# Patient Record
Sex: Female | Born: 1960 | Race: Black or African American | Hispanic: No | Marital: Single | Smoking: Never smoker
Health system: Southern US, Community
[De-identification: ages and names within clinical notes are randomized; demographics above are authoritative.]

## PROBLEM LIST (undated history)

## (undated) DIAGNOSIS — I10 Essential (primary) hypertension: Secondary | ICD-10-CM

---

## 2016-01-22 ENCOUNTER — Encounter: Payer: Self-pay | Admitting: Radiology

## 2016-01-22 ENCOUNTER — Emergency Department: Payer: Self-pay

## 2016-01-22 ENCOUNTER — Emergency Department
Admission: EM | Admit: 2016-01-22 | Discharge: 2016-01-23 | Disposition: A | Discharge status: Home / Self Care | Payer: Self-pay | Attending: Emergency Medicine | Admitting: Emergency Medicine

## 2016-01-22 DIAGNOSIS — K862 Cyst of pancreas: Secondary | ICD-10-CM | POA: Insufficient documentation

## 2016-01-22 DIAGNOSIS — K869 Disease of pancreas, unspecified: Secondary | ICD-10-CM

## 2016-01-22 DIAGNOSIS — K769 Liver disease, unspecified: Secondary | ICD-10-CM | POA: Insufficient documentation

## 2016-01-22 DIAGNOSIS — R1012 Left upper quadrant pain: Secondary | ICD-10-CM

## 2016-01-22 DIAGNOSIS — N1330 Unspecified hydronephrosis: Secondary | ICD-10-CM | POA: Insufficient documentation

## 2016-01-22 LAB — CBC WITH DIFFERENTIAL/PLATELET
BASOS ABS: 0.1 10*3/uL (ref 0–0.1)
Basophils Relative: 1 %
EOS PCT: 2 %
Eosinophils Absolute: 0.1 10*3/uL (ref 0–0.7)
HCT: 40 % (ref 35.0–47.0)
Hemoglobin: 13.2 g/dL (ref 12.0–16.0)
LYMPHS ABS: 2.2 10*3/uL (ref 1.0–3.6)
LYMPHS PCT: 36 %
MCH: 30.7 pg (ref 26.0–34.0)
MCHC: 33.1 g/dL (ref 32.0–36.0)
MCV: 92.6 fL (ref 80.0–100.0)
MONO ABS: 0.5 10*3/uL (ref 0.2–0.9)
Monocytes Relative: 9 %
Neutro Abs: 3.2 10*3/uL (ref 1.4–6.5)
Neutrophils Relative %: 52 %
Platelets: 214 10*3/uL (ref 150–440)
RBC: 4.32 MIL/uL (ref 3.80–5.20)
RDW: 14.8 % — ABNORMAL HIGH (ref 11.5–14.5)
WBC: 6 10*3/uL (ref 3.6–11.0)

## 2016-01-22 LAB — URINALYSIS COMPLETE WITH MICROSCOPIC (ARMC ONLY)
BACTERIA UA: NONE SEEN
Bilirubin Urine: NEGATIVE
Glucose, UA: NEGATIVE mg/dL
Ketones, ur: NEGATIVE mg/dL
Leukocytes, UA: NEGATIVE
Nitrite: NEGATIVE
PH: 6 (ref 5.0–8.0)
PROTEIN: NEGATIVE mg/dL
Specific Gravity, Urine: 1.021 (ref 1.005–1.030)

## 2016-01-22 LAB — BASIC METABOLIC PANEL
Anion gap: 5 (ref 5–15)
BUN: 11 mg/dL (ref 6–20)
CO2: 28 mmol/L (ref 22–32)
CREATININE: 0.54 mg/dL (ref 0.44–1.00)
Calcium: 9.6 mg/dL (ref 8.9–10.3)
Chloride: 108 mmol/L (ref 101–111)
GFR calc Af Amer: >60 mL/min (ref >60)
GLUCOSE: 109 mg/dL — AB (ref 65–99)
POTASSIUM: 3.3 mmol/L — AB (ref 3.5–5.1)
Sodium: 141 mmol/L (ref 135–145)

## 2016-01-22 LAB — LIPASE, BLOOD: Lipase: 90 U/L — ABNORMAL HIGH (ref 11–51)

## 2016-01-22 LAB — POCT PREGNANCY, URINE: Preg Test, Ur: NEGATIVE

## 2016-01-22 MED ORDER — IOPAMIDOL (ISOVUE-300) INJECTION 61%
100.0000 mL | Freq: Once | INTRAVENOUS | Status: AC | PRN
  Administered 2016-01-22: 100 mL via INTRAVENOUS
  Filled 2016-01-22: qty 100

## 2016-01-22 MED ORDER — IOPAMIDOL (ISOVUE-300) INJECTION 61%
30.0000 mL | Freq: Once | INTRAVENOUS | Status: AC | PRN
  Administered 2016-01-22: 30 mL via ORAL
  Filled 2016-01-22: qty 30

## 2016-01-22 MED ORDER — KETOROLAC TROMETHAMINE 30 MG/ML IJ SOLN
30.0000 mg | Freq: Once | INTRAMUSCULAR | Status: AC
  Administered 2016-01-22: 30 mg via INTRAVENOUS
  Filled 2016-01-22: qty 1

## 2016-01-22 NOTE — ED Notes (Signed)
Pt tender to palpation of LUQ, LLQ, and left flank. Pt reports taking 200 mg ibuprofen approx 1800 with no relief

## 2016-01-22 NOTE — ED Provider Notes (Signed)
[facility] Emergency Department Provider Note  ____________________________________________  Time seen: Approximately 7:50 PM  I have reviewed the triage vital signs and the nursing notes.   HISTORY  Chief Complaint Back Pain    HPI Elizabeth Chambers is a 55 y.o. female , NAD, presents to the emergency department accompanied by her husband who assists with history. Patient states she has had left back, flank and upper abdominal pain over the last 2 days. Patient seems to originate in the left upper quadrant and can radiate down the abdomen and around the back. Not had any nausea, vomiting, diarrhea, constipation. No changes in urination and denies dysuria or hematuria. As had no fevers, chills, body aches or weakness. No chest pain or shortness of breath. No significant medical history. Has taken over-the-counter ibuprofen with no relief of pain. Denies any injuries or traumas to the back or abdomen.   History reviewed. No pertinent past medical history.  There are no active problems to display for this patient.   No past surgical history on file.  Prior to Admission medications   Not on File    Allergies Review of patient's allergies indicates no known allergies.  No family history on file.  Social History Social History  Substance Use Topics  . Smoking status: Not on file  . Smokeless tobacco: Not on file  . Alcohol use Not on file     Review of Systems  Constitutional: No fever/chills, Fatigue Cardiovascular: No chest pain. Respiratory: No shortness of breath.  Gastrointestinal: Positive left upper quadrant abdominal pain, left flank pain.  No nausea, vomiting.  No diarrhea, constipation. Genitourinary: Negative for dysuria, hematuria. No urinary hesitancy, urgency or increased frequency. Musculoskeletal: Positive for back pain.  Skin: Negative for rash, redness, swelling, skin sores. Neurological: Negative for numbness, weakness,  tingling. 10-point ROS otherwise negative.  ____________________________________________   PHYSICAL EXAM:  VITAL SIGNS: ED Triage Vitals  Enc Vitals Group     BP 01/22/16 1915 (!) 186/94     Pulse Rate 01/22/16 1915 60     Resp 01/22/16 1915 18     Temp 01/22/16 1915 98.2 F (36.8 C)     Temp Source 01/22/16 1915 Oral     SpO2 01/22/16 1915 100 %     Weight 01/22/16 1914 160 lb (72.6 kg)     Height 01/22/16 1914 5\' 6"  (1.676 m)     Head Circumference --      Peak Flow --      Pain Score 01/22/16 1914 10     Pain Loc --      Pain Edu? --      Excl. in [place]? --      Constitutional: Alert and oriented. Well appearing and in no acute distress. Eyes: Conjunctivae are normal.  Head: Atraumatic. Neck: Supple with full range of motion. Hematological/Lymphatic/Immunilogical: No cervical lymphadenopathy. Cardiovascular: Normal rate, regular rhythm. Normal S1 and S2.  Good peripheral circulation. Respiratory: Normal respiratory effort without tachypnea or retractions. Lungs CTAB with breath sounds noted in all lung fields. Gastrointestinal: Tenderness to palpation about the left upper quadrant, left abdominal wall, left lower quadrant and right lower quadrant with some guarding of the left upper quadrant. No rebound or rigidity. Bowel sounds are grossly normal active in all quadrants. All quadrants of the abdomen were soft. No CVA tenderness. Musculoskeletal: Mild tenderness to palpation about the left lateral mid back without muscle spasm. No midline spinal tenderness about the thoracic or lumbar spine. No lower extremity  tenderness nor edema.  No joint effusions. Neurologic:  Normal speech and language. No gross focal neurologic deficits are appreciated.  Skin:  Skin is warm, dry and intact. No rash noted. Psychiatric: Mood and affect are normal. Speech and behavior are normal. Patient exhibits appropriate insight and judgement.   ____________________________________________    LABS (all labs ordered are listed, but only abnormal results are displayed)  Labs Reviewed  BASIC METABOLIC PANEL - Abnormal; Notable for the following:       Result Value   Potassium 3.3 (*)    Glucose, Bld 109 (*)    All other components within normal limits  CBC WITH DIFFERENTIAL/PLATELET - Abnormal; Notable for the following:    RDW 14.8 (*)    All other components within normal limits  LIPASE, BLOOD - Abnormal; Notable for the following:    Lipase 90 (*)    All other components within normal limits  URINALYSIS COMPLETEWITH MICROSCOPIC ([facility] ONLY) - Abnormal; Notable for the following:    Color, Urine YELLOW (*)    APPearance CLEAR (*)    Hgb urine dipstick 1+ (*)    Squamous Epithelial / LPF 0-5 (*)    All other components within normal limits  URINE CULTURE  POC URINE PREG, ED  POCT PREGNANCY, URINE   ____________________________________________  EKG  None ____________________________________________  RADIOLOGY I have personally viewed and evaluated these images (plain radiographs) as part of my medical decision making, as well as reviewing the written report by the radiologist.  Ct Abdomen Pelvis W Contrast  Result Date: 01/22/2016 CLINICAL DATA:  55 y/o F; left-sided abdominal pain radiating to the left lower back for 2 days. Complaints of nausea. Elevated lipase. EXAM: CT ABDOMEN AND PELVIS WITH CONTRAST TECHNIQUE: Multidetector CT imaging of the abdomen and pelvis was performed using the standard protocol following bolus administration of intravenous contrast. CONTRAST:  139mL ISOVUE-300 IOPAMIDOL (ISOVUE-300) INJECTION 61% COMPARISON:  None. FINDINGS: Lower chest: No acute abnormality. Hepatobiliary: 2.3 cm segment 7/8 flash filling lesion with late equilibration. Hypoattenuation with near the falciform ligament is likely represent focal steatosis. No other liver lesion identified. Normal gallbladder. No intra or extrahepatic biliary ductal dilatation. Pancreas: 10  mm cystic lesion in head of pancreas (series 2, image 31) probably in communication with the main pancreatic duct (series 5, image 41). No dilatation of the main pancreatic duct. No other pancreatic lesion or inflammatory changes are identified. No peripancreatic fluid collection or evidence for necrosis. Spleen: Normal in size without focal abnormality. Adrenals/Urinary Tract: Normal adrenal glands. Right kidney interpolar 9 mm lucency is probably a cyst. No other renal lesion or stone is identified. Mild left-sided hydronephrosis and question minimal urothelial enhancement of the ureter (series 2, image 70). No obstructing stone or lesion is identified. Bladder is unremarkable. Mild right pelviectasis. Stomach/Bowel: Stomach is within normal limits. Appendix appears normal. No evidence of bowel wall thickening, distention, or inflammatory changes. Vascular/Lymphatic: Aortic atherosclerosis. No enlarged abdominal or pelvic lymph nodes. Reproductive: The uterus and right adnexa is normal. Dense calcification in the left adnexa measuring up to 19 mm (series 2, image 71). Other: No abdominal wall hernia or abnormality. No abdominopelvic ascites. Musculoskeletal: No acute or significant osseous findings. IMPRESSION: 1. No peripancreatic fluid collection or necrosis identified. 2. Mild left hydronephrosis and question urothelial enhancement without an obstructing stone or lesion. This may represent urinary tract infection or reflux. 3. **An incidental finding of potential clinical significance has been found. 2.3 cm liver segment 7/8 flash filling lesion. Further  evaluation with hepatic MRI with contrast is recommended with possible biopsy if high risk patient. This recommendation follows ACR consensus guidelines: Managing Incidental Findings on Abdominal CT: White Paper of the ACR Incidental Findings Committee. J Am Coll Radiol 2010;7:754-773** 4. **An incidental finding of potential clinical significance has been  found. 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 Follow-up imaging in 1 year is recommended with pancreas protocol MRI or CT with contrast. This recommendation follows ACR consensus guidelines: Management of Incidental Pancreatic Cysts: A White Paper of the ACR Incidental Findings Committee. [place] [id number].** 5. Dense left adnexa calcification of uncertain significance, possibly sequelae of prior hematoma, underlying dermoid is also possible. This can be further characterized with pelvic ultrasound as clinically indicated. Electronically Signed   By: Kristine Garbe M.D.   On: 01/22/2016 22:55    ____________________________________________    PROCEDURES  Procedure(s) performed: None   Procedures   Medications  ketorolac (TORADOL) 30 MG/ML injection 30 mg (30 mg Intravenous Given 01/22/16 2016)  iopamidol (ISOVUE-300) 61 % injection 30 mL (30 mLs Oral Contrast Given 01/22/16 2135)  iopamidol (ISOVUE-300) 61 % injection 100 mL (100 mLs Intravenous Contrast Given 01/22/16 2137)     ____________________________________________   INITIAL IMPRESSION / ASSESSMENT AND PLAN / ED COURSE  Pertinent labs & imaging results that were available during my care of the patient were reviewed by me and considered in my medical decision making (see chart for details).  Clinical Course  Comment By Time  Laboratory results were discussed with patient and her husband at the bedside and she is in agreement to move forward with CT abdomen and pelvis with contrast. Braxton Feathers, PA-C 09/29 2040  Patient notes pain has decreased with Toradol IV. Still waiting on CT scan read. Braxton Feathers, PA-C 09/29 2240  I spoke with Dr. Corinna Capra in regards to the patient's history, physical exam, lab results and imaging results. He suggests consultation with GI in regards to the CT results. Braxton Feathers, PA-C  09/29 2335  I have paged Dr. Vira Agar who is on call for gastroenterology Braxton Feathers, PA-C 09/29 2338  Dr. Vira Agar is not on call for GI. Dr. Truman Hayward is on call. I have asked the ED secretary to page Dr. Truman Hayward. Braxton Feathers, PA-C 09/29 2355  I spoke with Dr. Truman Hayward who was on call for gastroenterology. He states as long as the patient is clinically stable she may be discharged with instructions to follow up with Dr. Truman Hayward for further outpatient workup of the pancreatic lesion. Braxton Feathers, PA-C 09/30 0000  I have spoken with the patient and her husband at the bedside in regards to CT results as well as the had with Dr. Truman Hayward. All questions were answered to their satisfaction. The patient verbalizes that she will call Dr. Marguerita Beards office on Monday to schedule follow-up of her CT findings. Currently the patient is without pain and considering her other laboratory evaluations were without significant abnormality, we will discharge her home with strict instructions to continue follow up with Dr. Truman Hayward outpatient. Braxton Feathers, PA-C 09/30 0012    Patient's diagnosis is consistent with Left upper quadrant abdominal pain, incidental pancreatic lesion found on CT, incidental lesion of liver found on CT, hydronephrosis of the left kidney. Patient will be discharged home with instructions to take over-the-counter ibuprofen or Tylenol as needed for pain. Patient is to  follow up with Dr. Truman Hayward in gastroenterology next week for further evaluation of pancreatic and liver lesions. Patient is also to follow-up with Dr. Alyson Ingles in urology in regards to left kidney hydronephrosis. No bacteria was noted on urinalysis as well as negative for leukocytes or nitrates. Urine culture was sent to further evaluate for potential urinary infection. At this time patient is asymptomatic of UTI and with a normal white blood count, antibiotics were not started at this time. If urine culture returns positive for infection or heavy growth of bacteria  patient will be called in antibiotics begun. Patient is given ED precautions to return to the ED for any worsening or new symptoms.    ____________________________________________  FINAL CLINICAL IMPRESSION(S) / ED DIAGNOSES  Final diagnoses:  LUQ abdominal pain  Pancreatic lesion  Lesion of liver  Hydronephrosis of left kidney      NEW MEDICATIONS STARTED DURING THIS VISIT:  New Prescriptions   No medications on file         Braxton Feathers, PA-C 01/23/16 0017    Eula Listen, MD 01/24/16 2016

## 2016-01-22 NOTE — ED Notes (Signed)
Pt c/o left-sided abdominal pain radiating to lower left back X 2 days. Pt c/o nausea, denies pain. Pt denies changes to urination.

## 2016-01-22 NOTE — ED Triage Notes (Signed)
Patient reports left lower back pain since yesterday, denies any type of injury.  Denies urinary symptoms.

## 2016-01-23 NOTE — ED Notes (Signed)
Reviewed d/c instructions, follow-up care with pt. Pt verbalized understanding 

## 2016-01-24 LAB — URINE CULTURE: SPECIAL REQUESTS: NORMAL

## 2016-01-25 ENCOUNTER — Telehealth: Payer: Self-pay

## 2016-01-25 NOTE — Telephone Encounter (Signed)
Dr. Allen Norris, please review images and advise. Pt has a large lesion on her pancreas and a lesion noted on her liver. Was seen in the ER for abdominal pain.

## 2016-01-25 NOTE — Telephone Encounter (Signed)
Patient is seen in Emergency Room. Reviewed chart and spoke with Dr. Dorothey Baseman nurse at this time. We will speak with Dr. Allen Norris and return phone call to patient.

## 2016-01-27 ENCOUNTER — Emergency Department: Payer: Self-pay

## 2016-01-27 ENCOUNTER — Ambulatory Visit (INDEPENDENT_AMBULATORY_CARE_PROVIDER_SITE_OTHER): Payer: Self-pay | Admitting: Urology

## 2016-01-27 ENCOUNTER — Emergency Department
Admission: EM | Admit: 2016-01-27 | Discharge: 2016-01-27 | Disposition: A | Discharge status: Home / Self Care | Payer: Self-pay | Attending: Student | Admitting: Student

## 2016-01-27 ENCOUNTER — Encounter: Payer: Self-pay | Admitting: Emergency Medicine

## 2016-01-27 VITALS — BP 212/108 | HR 76 | Ht 65.0 in | Wt 158.0 lb

## 2016-01-27 DIAGNOSIS — Z791 Long term (current) use of non-steroidal anti-inflammatories (NSAID): Secondary | ICD-10-CM | POA: Insufficient documentation

## 2016-01-27 DIAGNOSIS — N1330 Unspecified hydronephrosis: Secondary | ICD-10-CM

## 2016-01-27 DIAGNOSIS — R202 Paresthesia of skin: Secondary | ICD-10-CM | POA: Insufficient documentation

## 2016-01-27 DIAGNOSIS — I10 Essential (primary) hypertension: Secondary | ICD-10-CM | POA: Insufficient documentation

## 2016-01-27 LAB — COMPREHENSIVE METABOLIC PANEL
ALBUMIN: 4.1 g/dL (ref 3.5–5.0)
ALK PHOS: 70 U/L (ref 38–126)
ALT: 16 U/L (ref 14–54)
AST: 17 U/L (ref 15–41)
Anion gap: 6 (ref 5–15)
BILIRUBIN TOTAL: 0.5 mg/dL (ref 0.3–1.2)
BUN: 15 mg/dL (ref 6–20)
CALCIUM: 9.6 mg/dL (ref 8.9–10.3)
CO2: 26 mmol/L (ref 22–32)
Chloride: 107 mmol/L (ref 101–111)
Creatinine, Ser: 0.66 mg/dL (ref 0.44–1.00)
GFR calc Af Amer: >60 mL/min (ref >60)
GFR calc non Af Amer: >60 mL/min (ref >60)
GLUCOSE: 94 mg/dL (ref 65–99)
Potassium: 3.8 mmol/L (ref 3.5–5.1)
Sodium: 139 mmol/L (ref 135–145)
TOTAL PROTEIN: 7.4 g/dL (ref 6.5–8.1)

## 2016-01-27 LAB — CBC WITH DIFFERENTIAL/PLATELET
BASOS ABS: 0.1 10*3/uL (ref 0–0.1)
BASOS PCT: 1 %
Eosinophils Absolute: 0.1 10*3/uL (ref 0–0.7)
Eosinophils Relative: 2 %
HEMATOCRIT: 38.4 % (ref 35.0–47.0)
HEMOGLOBIN: 13.1 g/dL (ref 12.0–16.0)
Lymphocytes Relative: 39 %
Lymphs Abs: 2.7 10*3/uL (ref 1.0–3.6)
MCH: 31.2 pg (ref 26.0–34.0)
MCHC: 34.2 g/dL (ref 32.0–36.0)
MCV: 91.1 fL (ref 80.0–100.0)
MONOS PCT: 9 %
Monocytes Absolute: 0.6 10*3/uL (ref 0.2–0.9)
NEUTROS ABS: 3.5 10*3/uL (ref 1.4–6.5)
NEUTROS PCT: 49 %
Platelets: 233 10*3/uL (ref 150–440)
RBC: 4.21 MIL/uL (ref 3.80–5.20)
RDW: 14.7 % — ABNORMAL HIGH (ref 11.5–14.5)
WBC: 7 10*3/uL (ref 3.6–11.0)

## 2016-01-27 LAB — URINALYSIS, COMPLETE
Bilirubin, UA: NEGATIVE
Glucose, UA: NEGATIVE
KETONES UA: NEGATIVE
Leukocytes, UA: NEGATIVE
NITRITE UA: NEGATIVE
Protein, UA: NEGATIVE
Specific Gravity, UA: 1.02 (ref 1.005–1.030)
Urobilinogen, Ur: 1 mg/dL (ref 0.2–1.0)
pH, UA: 7 (ref 5.0–7.5)

## 2016-01-27 LAB — MICROSCOPIC EXAMINATION: Epithelial Cells (non renal): >10 /hpf — AB (ref 0–10)

## 2016-01-27 LAB — TROPONIN I
Troponin I: <0.03 ng/mL (ref <0.03)
Troponin I: <0.03 ng/mL (ref <0.03)

## 2016-01-27 MED ORDER — HYDROCHLOROTHIAZIDE 12.5 MG PO CAPS: 12.5000 mg | ORAL_CAPSULE | Freq: Every day | ORAL | 0 refills | Status: DC

## 2016-01-27 MED ORDER — HYDRALAZINE HCL 20 MG/ML IJ SOLN
10.0000 mg | Freq: Once | INTRAMUSCULAR | Status: AC
  Administered 2016-01-27: 10 mg via INTRAVENOUS
  Filled 2016-01-27: qty 1

## 2016-01-27 NOTE — Progress Notes (Signed)
   01/27/2016 9:18 AM   Elizabeth Chambers 05/07/1960 [id number]  Patient presented today to the office for new patient evaluation. On intake, her vitals were blood pressure 212/108, repeat 223/122. She also is endorsing chest pain and left shoulder pain. Heart rate was normal.  No lightheadedness or dizziness.  Given these abnormal vital findings and chest pain, patient was advised to go immediately to the emergency room. She is stable to drive herself and is accompanied by a friend/ family member.    UA/UCx ordered today.  She will reschedule at her convince.    Hollice Espy, MD

## 2016-01-27 NOTE — ED Triage Notes (Signed)
Patient presents to the ED today for htn.  Patient went to her follow-up appointment this morning after being seen in the ED on Friday for upper abdominal pain.  Patient states, "I went to get my pancreas checked out today."  Patient reports going to see Dr. Nehemiah Massed, the cardiologist.  Patient's blood pressure was over 200/100 at the office and was immediately sent to the ED.  Patient denies headache, dizziness, and blurry vision.  Reports occasional left arm tingling and numbness x 2 weeks.  Patient denies chest pain at this time.  Patient ambulatory to triage with no obvious distress at this time.

## 2016-01-27 NOTE — ED Provider Notes (Signed)
[facility] Emergency Department Provider Note   ____________________________________________   First MD Initiated Contact with Patient 01/27/16 [phone]     (approximate)  I have reviewed the triage vital signs and the nursing notes.   HISTORY  Chief Complaint Hypertension    HPI Elizabeth Chambers is a 55 y.o. female with no chronic medical problems who presents for evaluation of hypertension noted today, gradual onset, constant, severe, no modifying factors. She was seen in this emergency department on 01/22/2016 for left abdominal/flank pain. CT scan showed a pancreatic cyst as well as left-sided hydronephrosis, she was referred to urology for follow-up. She was being evaluated at the urologists office today at her follow-up appointment for the hydronephrosis when her blood pressure was noted to be severely elevated and she was sent to the emergency department. She denies any chest pain or difficulty breathing currently, reports that when she was seen on 12/2915 she had "a little" left-sided chest pain but none since. No shortness of breath. No headache, no blurred vision, no numbness or weakness in the arms or legs. She did report that intermittently over the past 2 weeks she feels "cramping and tingling" in the left arm   History reviewed. No pertinent past medical history.  There are no active problems to display for this patient.   History reviewed. No pertinent surgical history.  Prior to Admission medications   Medication Sig Start Date End Date Taking? Authorizing Provider  acetaminophen (TYLENOL) 500 MG tablet Take 500 mg by mouth every 6 (six) hours as needed.   Yes Historical Provider, MD    Allergies Review of patient's allergies indicates no known allergies.  No family history on file.  Social History Social History  Substance Use Topics  . Smoking status: Never Smoker  . Smokeless tobacco: Never Used  . Alcohol use No    Review of  Systems Constitutional: No fever/chills Eyes: No visual changes. ENT: No sore throat. Cardiovascular: Denies chest pain currently. Respiratory: Denies shortness of breath. Gastrointestinal: No abdominal pain.  No nausea, no vomiting.  No diarrhea.  No constipation. Genitourinary: Negative for dysuria. Musculoskeletal: Negative for back pain. Skin: Negative for rash. Neurological: Negative for headaches, focal weakness or numbness.  10-point ROS otherwise negative.  ____________________________________________   PHYSICAL EXAM:  Vitals:   01/27/16 1230 01/27/16 1300 01/27/16 1330 01/27/16 1353  BP: (!) 165/91 (!) 175/85 (!) 153/98 (!) 154/85  Pulse: 73 69 82 71  Resp: (!) 24 (!) 21 (!) 22 (!) 22  Temp:      TempSrc:      SpO2:    100%  Weight:      Height:        VITAL SIGNS: ED Triage Vitals  Enc Vitals Group     BP 01/27/16 0942 (!) 204/91     Pulse Rate 01/27/16 0942 62     Resp 01/27/16 0942 18     Temp 01/27/16 0942 98 F (36.7 C)     Temp Source 01/27/16 0942 Oral     SpO2 01/27/16 0942 100 %     Weight 01/27/16 0943 158 lb (71.7 kg)     Height 01/27/16 0943 5\' 6"  (1.676 m)     Head Circumference --      Peak Flow --      Pain Score 01/27/16 0944 0     Pain Loc --      Pain Edu? --      Excl. in [place]? --  Constitutional: Alert and oriented. Well appearing and in no acute distress. Eyes: Conjunctivae are normal. PERRL. EOMI. Head: Atraumatic. Nose: No congestion/rhinnorhea. Mouth/Throat: Mucous membranes are moist.  Oropharynx non-erythematous. Neck: No stridor.  Supple without meningismus. Cardiovascular: Normal rate, regular rhythm. Grossly normal heart sounds.  Good peripheral circulation. Respiratory: Normal respiratory effort.  No retractions. Lungs CTAB. Gastrointestinal: Soft and nontender. No distention. No CVA tenderness. Genitourinary: deferred Musculoskeletal: No lower extremity tenderness nor edema.  No joint effusions. Neurologic:  Normal  speech and language. No gross focal neurologic deficits are appreciated. No gait instability. 5 out of 5 strength in bilateral upper and lower extremities, sensation intact to light touch throughout, cranial nerves II through XII intact, normal finger-nose-finger, normal heel shin Skin:  Skin is warm, dry and intact. No rash noted. Psychiatric: Mood and affect are normal. Speech and behavior are normal.  ____________________________________________   LABS (all labs ordered are listed, but only abnormal results are displayed)  Labs Reviewed  CBC WITH DIFFERENTIAL/PLATELET - Abnormal; Notable for the following:       Result Value   RDW 14.7 (*)    All other components within normal limits  COMPREHENSIVE METABOLIC PANEL  TROPONIN I  TROPONIN I   ____________________________________________  EKG  ED ECG REPORT I, Joanne Gavel, the attending physician, personally viewed and interpreted this ECG.   Date: 01/27/2016  EKG Time: 09:39  Rate: 62  Rhythm: normal sinus rhythm  Axis: normal  Intervals:none  ST&T Change: No acute ST elevation. ST depression in lead 2, 3, aVF, V4, V5, V6 secondary to LVH with repolarization abnormality.  ____________________________________________  RADIOLOGY  CT head IMPRESSION:  Normal head CT.       CXR IMPRESSION:  No active cardiopulmonary disease.    ____________________________________________   PROCEDURES  Procedure(s) performed: None  Procedures  Critical Care performed: No  ____________________________________________   INITIAL IMPRESSION / ASSESSMENT AND PLAN / ED COURSE  Pertinent labs & imaging results that were available during my care of the patient were reviewed by me and considered in my medical decision making (see chart for details).  Elizabeth Chambers is a 55 y.o. female with no chronic medical problems who presents for evaluation of hypertension noted today, gradual onset, constant, severe, no modifying  factors. On exam, she is well-appearing and in no acute distress. Her blood pressure was elevated 204/91 on arrival however technically, at this time she is asymptomatic. Will lower her blood pressure slowly. Given her reports of chest pain several days ago as well as intermittent numbness/tingling in the left arm, we'll obtain screening labs, CT head, chest x-ray to evaluate for end organ dysfunction. Currently, she has an intact neurological examination and no complaints.  ----------------------------------------- 1:54 PM on 01/27/2016 ----------------------------------------- Patient continues to appear well with no complaints. Blood pressure has improved. 2 sets of cardiac enzymes are negative. CBC CMP unremarkable. CT head shows no acute intracranial process. Chest x-ray shows no active cardiopulmonary disease. I discussed the EKG with Dr. Humphrey Rolls of cardiology who believes that the ST depressions noted are most consistent with LVH with repolarization abnormality and are not ischemic, he will see the patient in clinic tomorrow at 1:30 PM. Her clinical picture is not consistent with ACS, there is no evidence of end organ dysfunction due to her hypertension. We discussed return precautions and need for close follow-up and she is comfortable with the discharge plan. DC home with HCTZ.   Clinical Course     ____________________________________________   FINAL CLINICAL  IMPRESSION(S) / ED DIAGNOSES  Final diagnoses:  Essential hypertension      NEW MEDICATIONS STARTED DURING THIS VISIT:  New Prescriptions   No medications on file     Note:  This document was prepared using Dragon voice recognition software and may include unintentional dictation errors.    Joanne Gavel, MD 01/27/16 1356

## 2016-01-28 ENCOUNTER — Other Ambulatory Visit: Payer: Self-pay

## 2016-01-28 DIAGNOSIS — K862 Cyst of pancreas: Secondary | ICD-10-CM

## 2016-01-28 DIAGNOSIS — K769 Liver disease, unspecified: Secondary | ICD-10-CM

## 2016-01-29 ENCOUNTER — Other Ambulatory Visit: Payer: Self-pay

## 2016-01-29 ENCOUNTER — Telehealth: Payer: Self-pay

## 2016-01-29 DIAGNOSIS — K862 Cyst of pancreas: Secondary | ICD-10-CM

## 2016-01-29 NOTE — Telephone Encounter (Signed)
  Oncology Nurse Navigator Documentation Received referral from Dr. Allen Norris for EUS to evaluate a pancreatic cyst. Scheduled for 11/9 at [facility] with Dr Cephas Darby. Voicemail left with [name] to notify and educate regarding instructions. Call back number left. Navigator Location: [facility] (01/29/16 0900) Navigator Encounter Type: Telephone (01/29/16 0900) Telephone: Outgoing Call (01/29/16 0900)             Barriers/Navigation Needs: Coordination of Care (01/29/16 0900)   Interventions: Coordination of Care (01/29/16 0900)   Coordination of Care: EUS (01/29/16 0900)                  Time Spent with Patient: 15 (01/29/16 0900)

## 2016-02-01 ENCOUNTER — Telehealth: Payer: Self-pay

## 2016-02-01 NOTE — Telephone Encounter (Signed)
Tried contacting pt several times  to give MRI appt information to. VM was full and I was not able to leave message.

## 2016-02-02 ENCOUNTER — Telehealth: Payer: Self-pay | Admitting: Emergency Medicine

## 2016-02-02 ENCOUNTER — Telehealth: Payer: Self-pay

## 2016-02-02 NOTE — Telephone Encounter (Signed)
  Oncology Nurse Navigator Documentation                                Another attempt made at contacting Ms. Limehouse to schedule EUS for pancreatic findings. Voicemail is full, unable to leave message.                     Oncology Nurse Navigator Documentation  Navigator Location: [facility] (02/02/16 1500) Navigator Encounter Type: Telephone (02/02/16 1500) Telephone: [place] Call (02/02/16 1500)                     Coordination of Care: EUS (02/02/16 1500)                  Time Spent with Patient: 15 (02/02/16 1500)

## 2016-02-02 NOTE — Telephone Encounter (Signed)
Called patient to assure she is aware of incidental finding on ct report.  She says she is aware, and has made appt for mri and pancreas follow up appt .

## 2016-02-03 ENCOUNTER — Telehealth: Payer: Self-pay

## 2016-02-03 NOTE — Telephone Encounter (Signed)
  Oncology Nurse Navigator Documentation Spoke with Angela Burke (significant other). EUS has been scheduled for 11-9 with Dr Cephas Darby at [facility]. Went over instructions and copy also mailed to home address. Denies diabetes. Only medication is HCTZ. Denies anitcoagulants. Had questions regarding upcoming appts. Went over MRI for 10/17 and new patient referral for Dr. Allen Norris 10/25. They are under the impression that on 10/25 she will be having a biopsy of something. I sent message to Ginger at Dr. Dorothey Baseman to call them regarding upcoming appt 10/25. He will be expecting call. Provided my contact information and encouraged him to call with any future questions regarding EUS.  INSTRUCTIONS FOR ENDOSCOPIC ULTRASOUND -Your procedure has been scheduled for November 9th with Dr. Cephas Darby at [facility]. -The hospital will contact you to pre-register over the phone.  -To get your scheduled arrival time, please call the Endoscopy unit at  [phone] between 1-3pm on:  November 8th   -ON THE DAY OF YOU PROCEDURE:   1. If you are scheduled for a morning procedure, nothing to drink after midnight  -If you are scheduled for an afternoon procedure, you may have clear liquids until 5 hours prior  to the procedure but no carbonated drinks or broth  2. NO FOOD THE DAY OF YOUR PROCEDURE  3. You may take your heart, seizure, blood pressure, Parkinson's or breathing medications at  6am with just enough water to get your pills down  4. Do not take any oral Diabetic medications the morning of your procedure.  5. If you are a diabetic and are using insulin, please notify your prescribing physician of this  procedure as your dose may need to be altered related to not being able to eat or drink.   5. Do not take Vitamins for 5 days before your procedure     -On the day of your procedure, come to the [facility] Admitting/Registration desk (First desk on the right) at the scheduled arrival time. You  MUST have someone drive you home from your procedure. You must have a responsible adult with a valid driver's license who is on site throughout your entire procedure and who can stay with you for several hours after your procedure. You may not go home alone in a taxi, shuttle [place] or bus, as the drivers will not be responsible for you.  --If you have any questions please call me at the above contact Navigator Location: [facility] (02/03/16 1400) Navigator Encounter Type: Telephone (02/03/16 1400) Telephone: [place] Call (02/03/16 1400)                     Coordination of Care: EUS (02/03/16 1400)                  Time Spent with Patient: 15 (02/03/16 1400)

## 2016-02-03 NOTE — Telephone Encounter (Signed)
Pt has been scheduled for an MRI of the Abdomen/Pelvis at [facility] on Tuesday, Oct 17th @ 8:00am. Pt will return my call to discuss appt information and instructions.

## 2016-02-04 ENCOUNTER — Other Ambulatory Visit: Payer: Self-pay

## 2016-02-09 ENCOUNTER — Ambulatory Visit
Admission: RE | Admit: 2016-02-09 | Discharge: 2016-02-09 | Disposition: A | Discharge status: Home / Self Care | Payer: Self-pay | Source: Ambulatory Visit | Attending: Gastroenterology | Admitting: Gastroenterology

## 2016-02-09 DIAGNOSIS — M47816 Spondylosis without myelopathy or radiculopathy, lumbar region: Secondary | ICD-10-CM | POA: Insufficient documentation

## 2016-02-09 DIAGNOSIS — N281 Cyst of kidney, acquired: Secondary | ICD-10-CM | POA: Insufficient documentation

## 2016-02-09 DIAGNOSIS — M5136 Other intervertebral disc degeneration, lumbar region: Secondary | ICD-10-CM | POA: Insufficient documentation

## 2016-02-09 DIAGNOSIS — K862 Cyst of pancreas: Secondary | ICD-10-CM | POA: Insufficient documentation

## 2016-02-09 DIAGNOSIS — K769 Liver disease, unspecified: Secondary | ICD-10-CM | POA: Insufficient documentation

## 2016-02-09 DIAGNOSIS — D1803 Hemangioma of intra-abdominal structures: Secondary | ICD-10-CM | POA: Insufficient documentation

## 2016-02-09 MED ORDER — GADOBENATE DIMEGLUMINE 529 MG/ML IV SOLN
15.0000 mL | Freq: Once | INTRAVENOUS | Status: AC | PRN
  Administered 2016-02-09: 15 mL via INTRAVENOUS

## 2016-02-17 ENCOUNTER — Ambulatory Visit: Payer: Self-pay | Admitting: Gastroenterology

## 2016-02-24 ENCOUNTER — Ambulatory Visit: Payer: Self-pay | Admitting: Urology

## 2016-02-24 ENCOUNTER — Encounter: Payer: Self-pay | Admitting: Urology

## 2016-03-02 ENCOUNTER — Other Ambulatory Visit: Payer: Self-pay | Admitting: Pathology

## 2016-03-03 ENCOUNTER — Encounter: Payer: Self-pay | Admitting: *Deleted

## 2016-03-03 ENCOUNTER — Ambulatory Visit: Payer: Self-pay | Admitting: Anesthesiology

## 2016-03-03 ENCOUNTER — Ambulatory Visit
Admission: RE | Admit: 2016-03-03 | Discharge: 2016-03-03 | Disposition: A | Discharge status: Home / Self Care | Payer: Self-pay | Source: Ambulatory Visit | Attending: Gastroenterology | Admitting: Gastroenterology

## 2016-03-03 ENCOUNTER — Encounter
Admission: RE | Disposition: A | Discharge status: Home / Self Care | Payer: Self-pay | Source: Ambulatory Visit | Attending: Gastroenterology

## 2016-03-03 ENCOUNTER — Inpatient Hospital Stay: Payer: Self-pay

## 2016-03-03 DIAGNOSIS — I10 Essential (primary) hypertension: Secondary | ICD-10-CM | POA: Insufficient documentation

## 2016-03-03 DIAGNOSIS — K862 Cyst of pancreas: Secondary | ICD-10-CM | POA: Insufficient documentation

## 2016-03-03 DIAGNOSIS — Z79899 Other long term (current) drug therapy: Secondary | ICD-10-CM | POA: Insufficient documentation

## 2016-03-03 DIAGNOSIS — F17200 Nicotine dependence, unspecified, uncomplicated: Secondary | ICD-10-CM | POA: Insufficient documentation

## 2016-03-03 DIAGNOSIS — J449 Chronic obstructive pulmonary disease, unspecified: Secondary | ICD-10-CM | POA: Insufficient documentation

## 2016-03-03 HISTORY — PX: UPPER ESOPHAGEAL ENDOSCOPIC ULTRASOUND (EUS): SHX6562

## 2016-03-03 HISTORY — DX: Essential (primary) hypertension: I10

## 2016-03-03 SURGERY — UPPER ESOPHAGEAL ENDOSCOPIC ULTRASOUND (EUS)
Anesthesia: General

## 2016-03-03 MED ORDER — LIDOCAINE 2% (20 MG/ML) 5 ML SYRINGE
INTRAMUSCULAR | Status: DC | PRN
  Administered 2016-03-03: 40 mg via INTRAVENOUS

## 2016-03-03 MED ORDER — SODIUM CHLORIDE 0.9 % IV SOLN
INTRAVENOUS | Status: DC
  Administered 2016-03-03: 1000 mL via INTRAVENOUS
  Administered 2016-03-03: 14:00:00 via INTRAVENOUS

## 2016-03-03 MED ORDER — PROPOFOL 10 MG/ML IV BOLUS
INTRAVENOUS | Status: DC | PRN
  Administered 2016-03-03: 100 mg via INTRAVENOUS

## 2016-03-03 MED ORDER — FENTANYL CITRATE (PF) 100 MCG/2ML IJ SOLN
INTRAMUSCULAR | Status: DC | PRN
  Administered 2016-03-03: 50 ug via INTRAVENOUS

## 2016-03-03 MED ORDER — MIDAZOLAM HCL 5 MG/5ML IJ SOLN
INTRAMUSCULAR | Status: DC | PRN
  Administered 2016-03-03: 1 mg via INTRAVENOUS

## 2016-03-03 MED ORDER — PROPOFOL 500 MG/50ML IV EMUL
INTRAVENOUS | Status: DC | PRN
  Administered 2016-03-03: 160 ug/kg/min via INTRAVENOUS

## 2016-03-03 NOTE — Op Note (Signed)
[facility] Gastroenterology Patient Name: Elizabeth Chambers Procedure Date: 03/03/2016 1:59 PM MRN: [id number] Account #: [id number] Date of Birth: 08/21/1960 Admit Type: Outpatient Age: 55 Room: [facility] ENDO ROOM 3 Gender: Female Note Status: Finalized Procedure:            Upper EUS Indications:          Pancreatic cyst on MRI in a patient without a prior                        history of pancreatitis Patient Profile:      Refer to note in patient chart for documentation of                        history and physical. Providers:            Lenetta Quaker. Cephas Darby, MD Referring MD:         Lucilla Lame MD, MD (Referring MD) Medicines:            Monitored Anesthesia Care Complications:        No immediate complications. Procedure:            Pre-Anesthesia Assessment:                       - See the other procedure note for documentation of the                        pre-procedure assessment.                       After obtaining informed consent, the endoscope was                        passed under direct vision. Throughout the procedure,                        the patient's blood pressure, pulse, and oxygen                        saturations were monitored continuously. The EUS GI                        Linear Array [id number] was introduced through the mouth,                        and advanced to the duodenum for ultrasound examination                        from the esophagus, stomach and duodenum. Findings:      Endoscopic Finding :      The entire visualized stomach was normal.      The duodenal bulb, first portion of the duodenum, second portion of the       duodenum and area of the papilla were normal.      Endosonographic Finding :      An anechoic lesion suggestive of a cyst was identified in the pancreatic       head. It is not in obvious communication with the pancreatic duct. The       lesion measured 9.9 mm by 8.2 mm in maximal cross-sectional diameter.   There was a single compartment without septae. The  outer wall of the       lesion was not seen. There was no associated mass. There was no internal       debris within the fluid-filled cavity.      Pancreatic parenchymal abnormalities were noted in the entire pancreas.       These consisted of hyperechoic strands. The pancreatic duct measured 2.3       mm in the head, 2.2 mm in the neck and 0.9 mm in the body. It was not       well visualized in the tail.      There was no sign of significant endosonographic abnormality in the       common bile duct and in the common hepatic duct. The CBD measured 2.3 mm       and CHD measured 3.5 mm.      There was no sign of significant endosonographic abnormality in the left       lobe of the liver.      No lymph nodes were visualized in the celiac region (level 20),       peripancreatic region and porta hepatis region. Impression:           EGD Impresssion:                       - The esophagus was not visuazlied with the duodenscope.                       - Normal stomach.                       - Normal duodenal bulb, first portion of the duodenum,                        second portion of the duodenum and area of the papilla.                       EUS Impression:                       - A cystic lesion was seen in the pancreatic head.                        Tissue has not been obtained. 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 Given size less than 1 cm and likely                        inability to obtain adequate fluid for CEA and amylase                        this was not sampled.                       - Pancreatic parenchymal abnormalities consisting of  hyperechoic strands were noted in the entire pancreas.                        While these findings can be seen in  chronic                        pancreatitis in isolation the do not fulfill criteria                        for chronic pancrea and can be seen in a normal                        pancreas.                       - There was no sign of significant pathology in the                        common bile duct and in the common hepatic duct.                       - There was no evidence of significant pathology in the                        left lobe of the liver.                       - No lymph nodes were visualized in the celiac region                        (level 20), peripancreatic region and porta hepatis                        region.                       - No specimens collected. Recommendation:       - Discharge patient to home (via wheelchair).                       - Perform MRCP in 1 year to ensure cyst stability.                       - Return to referring physician as previously scheduled.                       - The findings and recommendations were discussed with                        the patient and their family. Attending Participation:      I personally performed the entire procedure. Dr. Lenetta Quaker. Cephas Darby, MD Lenetta Quaker. Solenne Manwarren, MD 03/03/2016 2:42:20 PM This report has been signed electronically. Number of Addenda: 0 Note Initiated On: 03/03/2016 1:59 PM Estimated Blood Loss: Estimated blood loss: none.      [facility]

## 2016-03-03 NOTE — Anesthesia Preprocedure Evaluation (Signed)
Anesthesia Evaluation  Patient identified by MRN, date of birth, ID band Patient awake    Reviewed: Allergy & Precautions, NPO status , Patient's Chart, lab work & pertinent test results  Airway Mallampati: II       Dental  (+) Teeth Intact   Pulmonary COPD, Current Smoker,     + decreased breath sounds      Cardiovascular Exercise Tolerance: Good hypertension, Pt. on medications  Rhythm:Regular Rate:Normal     Neuro/Psych negative neurological ROS     GI/Hepatic negative GI ROS, Neg liver ROS,   Endo/Other  negative endocrine ROS  Renal/GU negative Renal ROS     Musculoskeletal   Abdominal Normal abdominal exam  (+)   Peds negative pediatric ROS (+)  Hematology negative hematology ROS (+)   Anesthesia Other Findings   Reproductive/Obstetrics                             Anesthesia Physical Anesthesia Plan  ASA: II  Anesthesia Plan: General   Post-op Pain Management:    Induction: Intravenous  Airway Management Planned: Natural Airway and Nasal Cannula  Additional Equipment:   Intra-op Plan:   Post-operative Plan:   Informed Consent: I have reviewed the patients History and Physical, chart, labs and discussed the procedure including the risks, benefits and alternatives for the proposed anesthesia with the patient or authorized representative who has indicated his/her understanding and acceptance.     Plan Discussed with: CRNA  Anesthesia Plan Comments:         Anesthesia Quick Evaluation

## 2016-03-03 NOTE — H&P (Signed)
PRE-PROCEDURE HISTORY AND PHYSICAL   Elizabeth Chambers presents for her scheduled Procedure(s): UPPER ESOPHAGEAL ENDOSCOPIC ULTRASOUND (EUS).  The indication for the procedure(s) is PANCREATIC CYST.  There have been no significant recent changes in the patient's medical status.  Past Medical History:  Diagnosis Date  . Hypertension     History reviewed. No pertinent surgical history.  Allergies No Known Allergies  Medications acetaminophen and hydrochlorothiazide  Physical Examination  Body mass index is 24.53 kg/m. Mental Status: Alert and oriented Lungs: CTAB Heart:RRR Abdomen: Soft NT ND BS present  ASSESSMENT AND PLAN  [name] has been evaluated and deemed appropriate to undergo the planned Procedure(s): UPPER ESOPHAGEAL ENDOSCOPIC ULTRASOUND (EUS).

## 2016-03-03 NOTE — Transfer of Care (Signed)
Immediate Anesthesia Transfer of Care Note  Patient: Elizabeth Chambers  Procedure(s) Performed: Procedure(s): UPPER ESOPHAGEAL ENDOSCOPIC ULTRASOUND (EUS) (N/A)  Patient Location: PACU and Endoscopy Unit  Anesthesia Type:General  Level of Consciousness: awake and patient cooperative  Airway & Oxygen Therapy: Patient Spontanous Breathing and Patient connected to nasal cannula oxygen  Post-op Assessment: Report given to RN and Post -op Vital signs reviewed and stable  Post vital signs: Reviewed and stable  Last Vitals:  Vitals:   03/03/16 1312  BP: (!) 157/89  Pulse: 68  Resp: 17  Temp: 36.3 C    Last Pain:  Vitals:   03/03/16 1312  TempSrc: Tympanic         Complications: No apparent anesthesia complications

## 2016-03-04 ENCOUNTER — Encounter: Payer: Self-pay | Admitting: Gastroenterology

## 2016-03-04 NOTE — Anesthesia Postprocedure Evaluation (Signed)
Anesthesia Post Note  Patient: Elizabeth Chambers  Procedure(s) Performed: Procedure(s) (LRB): UPPER ESOPHAGEAL ENDOSCOPIC ULTRASOUND (EUS) (N/A)  Patient location during evaluation: PACU Anesthesia Type: General Level of consciousness: awake Pain management: pain level controlled Vital Signs Assessment: post-procedure vital signs reviewed and stable Respiratory status: spontaneous breathing Cardiovascular status: blood pressure returned to baseline Anesthetic complications: no    Last Vitals:  Vitals:   03/03/16 1449 03/03/16 1459  BP: (!) 151/97 (!) 169/92  Pulse: (!) 55 66  Resp: (!) 21 (!) 21  Temp:      Last Pain:  Vitals:   03/03/16 1439  TempSrc: Tympanic                 VAN STAVEREN,Renatta Shrieves

## 2016-03-04 NOTE — Progress Notes (Signed)
  Oncology Nurse Navigator Documentation EUS results routed to Dr. Allen Norris. Navigator Location: [facility] (03/04/16 1000)   )Navigator Encounter Type: Letter/Fax/Email (03/04/16 1000)                                 Coordination of Care: EUS (03/04/16 1000)                  Time Spent with Patient: 15 (03/04/16 1000)

## 2016-03-07 ENCOUNTER — Telehealth: Payer: Self-pay

## 2016-03-07 NOTE — Telephone Encounter (Signed)
Pt has a follow up appt with Dr. Allen Norris tomorrow (03/08/16) to discuss EUS and MRI results. Appointment was confirmed today.

## 2016-03-07 NOTE — Telephone Encounter (Signed)
-----   Message from Lucilla Lame, MD sent at 03/07/2016  1:31 PM EST ----- Let the patient know that the endoscopic ultrasound did not show anything consistent with a cancerous lesion. It was recommended that she get a repeat MRCP in one year.

## 2016-03-08 ENCOUNTER — Ambulatory Visit: Payer: Self-pay | Admitting: Gastroenterology

## 2016-03-08 ENCOUNTER — Telehealth: Payer: Self-pay

## 2016-03-08 NOTE — Telephone Encounter (Signed)
Patient came 15 minutes late to her app. When asked if she had insurance, patient replied with a smirk comment "Doesn't it already tell you that I don't have insurance?" I replied back saying that I always make sure. I told the patient that it will be a $50 dollar down payment and that the rest will be billed to her. She then replied "Oh, since I dont have the $50 that means I can't be seen?" I let her know that I will ask the doctor and that I'd do it after checking in the next patient (that was here on time). During the time that I was checking in the patient, she stood up, walked out the door, and did not come back.

## 2017-02-08 ENCOUNTER — Emergency Department: Payer: No Typology Code available for payment source

## 2017-02-08 ENCOUNTER — Encounter: Payer: Self-pay | Admitting: Emergency Medicine

## 2017-02-08 ENCOUNTER — Emergency Department
Admission: EM | Admit: 2017-02-08 | Discharge: 2017-02-08 | Disposition: A | Discharge status: Home / Self Care | Payer: No Typology Code available for payment source | Attending: Emergency Medicine | Admitting: Emergency Medicine

## 2017-02-08 DIAGNOSIS — Y939 Activity, unspecified: Secondary | ICD-10-CM | POA: Diagnosis not present

## 2017-02-08 DIAGNOSIS — Y999 Unspecified external cause status: Secondary | ICD-10-CM | POA: Diagnosis not present

## 2017-02-08 DIAGNOSIS — S161XXA Strain of muscle, fascia and tendon at neck level, initial encounter: Secondary | ICD-10-CM | POA: Diagnosis not present

## 2017-02-08 DIAGNOSIS — I10 Essential (primary) hypertension: Secondary | ICD-10-CM | POA: Insufficient documentation

## 2017-02-08 DIAGNOSIS — F17200 Nicotine dependence, unspecified, uncomplicated: Secondary | ICD-10-CM | POA: Diagnosis not present

## 2017-02-08 DIAGNOSIS — M7918 Myalgia, other site: Secondary | ICD-10-CM

## 2017-02-08 DIAGNOSIS — Y92410 Unspecified street and highway as the place of occurrence of the external cause: Secondary | ICD-10-CM | POA: Insufficient documentation

## 2017-02-08 DIAGNOSIS — S199XXA Unspecified injury of neck, initial encounter: Secondary | ICD-10-CM | POA: Diagnosis present

## 2017-02-08 MED ORDER — TRAMADOL HCL 50 MG PO TABS: 50.0000 mg | ORAL_TABLET | Freq: Two times a day (BID) | ORAL | 0 refills | Status: DC | PRN

## 2017-02-08 MED ORDER — TRAMADOL HCL 50 MG PO TABS
50.0000 mg | ORAL_TABLET | Freq: Once | ORAL | Status: AC
Start: 2017-02-08 — End: 2017-02-08
  Administered 2017-02-08: 50 mg via ORAL
  Filled 2017-02-08: qty 1

## 2017-02-08 MED ORDER — IBUPROFEN 600 MG PO TABS
600.0000 mg | ORAL_TABLET | Freq: Once | ORAL | Status: AC
  Administered 2017-02-08: 600 mg via ORAL
  Filled 2017-02-08: qty 1

## 2017-02-08 MED ORDER — IBUPROFEN 600 MG PO TABS: 600.0000 mg | ORAL_TABLET | Freq: Three times a day (TID) | ORAL | 0 refills | Status: DC | PRN

## 2017-02-08 MED ORDER — CYCLOBENZAPRINE HCL 10 MG PO TABS
10.0000 mg | ORAL_TABLET | Freq: Once | ORAL | Status: AC
  Administered 2017-02-08: 10 mg via ORAL
  Filled 2017-02-08: qty 1

## 2017-02-08 MED ORDER — CYCLOBENZAPRINE HCL 10 MG PO TABS: 10.0000 mg | ORAL_TABLET | Freq: Three times a day (TID) | ORAL | 0 refills | Status: DC | PRN
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
## 2017-02-11 ENCOUNTER — Emergency Department
Admission: EM | Admit: 2017-02-11 | Discharge: 2017-02-11 | Disposition: A | Discharge status: Home / Self Care | Payer: No Typology Code available for payment source | Attending: Emergency Medicine | Admitting: Emergency Medicine

## 2017-02-11 DIAGNOSIS — M479 Spondylosis, unspecified: Secondary | ICD-10-CM | POA: Insufficient documentation

## 2017-02-11 DIAGNOSIS — M7918 Myalgia, other site: Secondary | ICD-10-CM

## 2017-02-11 DIAGNOSIS — I10 Essential (primary) hypertension: Secondary | ICD-10-CM | POA: Insufficient documentation

## 2017-02-11 DIAGNOSIS — M47892 Other spondylosis, cervical region: Secondary | ICD-10-CM

## 2017-02-11 DIAGNOSIS — M542 Cervicalgia: Secondary | ICD-10-CM | POA: Diagnosis present

## 2017-02-11 MED ORDER — METHOCARBAMOL 750 MG PO TABS: 750.0000 mg | ORAL_TABLET | Freq: Four times a day (QID) | ORAL | 0 refills | Status: DC

## 2017-02-11 MED ORDER — MELOXICAM 15 MG PO TABS: 15.0000 mg | ORAL_TABLET | Freq: Every day | ORAL | 0 refills | Status: DC
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
## 2017-02-11 NOTE — ED Notes (Signed)
Patient reports pain 7/10, understands AVS review and p[roviders instructions. Patient in no apparent distress.
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
## 2017-08-14 ENCOUNTER — Emergency Department: Payer: BLUE CROSS/BLUE SHIELD

## 2017-08-14 ENCOUNTER — Other Ambulatory Visit: Payer: Self-pay

## 2017-08-14 ENCOUNTER — Emergency Department
Admission: EM | Admit: 2017-08-14 | Discharge: 2017-08-14 | Disposition: A | Discharge status: Home / Self Care | Payer: BLUE CROSS/BLUE SHIELD | Attending: Emergency Medicine | Admitting: Emergency Medicine

## 2017-08-14 ENCOUNTER — Encounter: Payer: Self-pay | Admitting: Emergency Medicine

## 2017-08-14 DIAGNOSIS — I10 Essential (primary) hypertension: Secondary | ICD-10-CM | POA: Diagnosis not present

## 2017-08-14 DIAGNOSIS — M542 Cervicalgia: Secondary | ICD-10-CM | POA: Insufficient documentation

## 2017-08-14 MED ORDER — CYCLOBENZAPRINE HCL 5 MG PO TABS: 5.0000 mg | ORAL_TABLET | Freq: Three times a day (TID) | ORAL | 0 refills | Status: AC | PRN

## 2017-08-14 MED ORDER — MELOXICAM 15 MG PO TABS: 15.0000 mg | ORAL_TABLET | Freq: Every day | ORAL | 1 refills | Status: AC

## 2017-08-14 MED ORDER — KETOROLAC TROMETHAMINE 30 MG/ML IJ SOLN
30.0000 mg | Freq: Once | INTRAMUSCULAR | Status: AC
  Administered 2017-08-14: 30 mg via INTRAMUSCULAR
  Filled 2017-08-14: qty 1

## 2017-08-14 NOTE — ED Notes (Signed)
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
## 2018-01-17 IMAGING — CT CT ABD-PELV W/ CM
2 of 5 series · 14 of 46 positions shown, 16 images · IV contrast (APPLIED)
Comparison: None.

CLINICAL DATA: 55 y/o F; left-sided abdominal pain radiating to the
left lower back for 2 days. Complaints of nausea. Elevated lipase.

EXAM:
CT ABDOMEN AND PELVIS WITH CONTRAST
TECHNIQUE: Multidetector CT imaging of the abdomen and pelvis was performed
using the standard protocol following bolus administration of
intravenous contrast.
CONTRAST:  100mL DPMECX-EXX IOPAMIDOL (DPMECX-EXX) INJECTION 61%

[Series 2: axial st · axial · 0.73mm/px · z∈[-784,-384]mm · 11 of 91 slices shown, 13 images]
[im 6/91  soft-tissue]
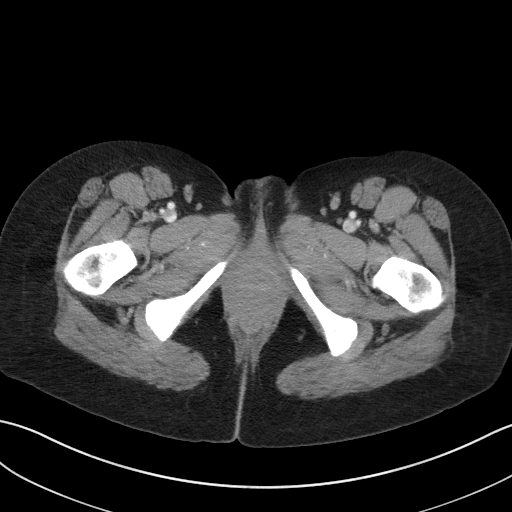
[im 6/91  bone]
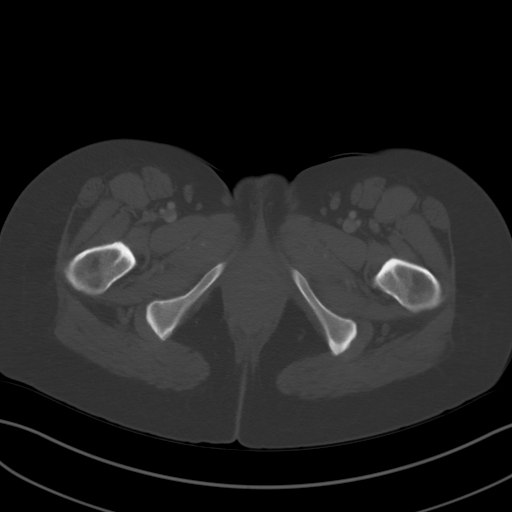
[im 16/91  soft-tissue]
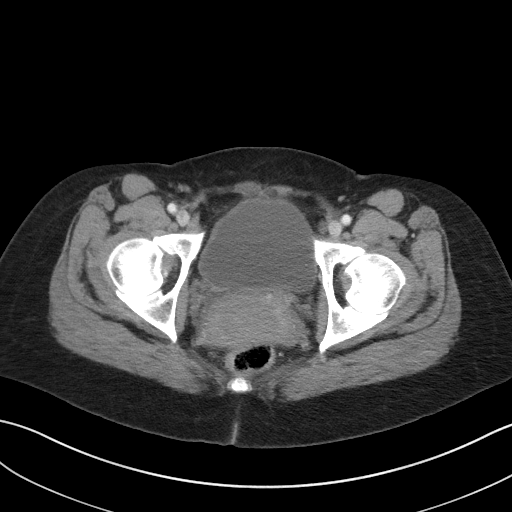
[im 21/91  soft-tissue]
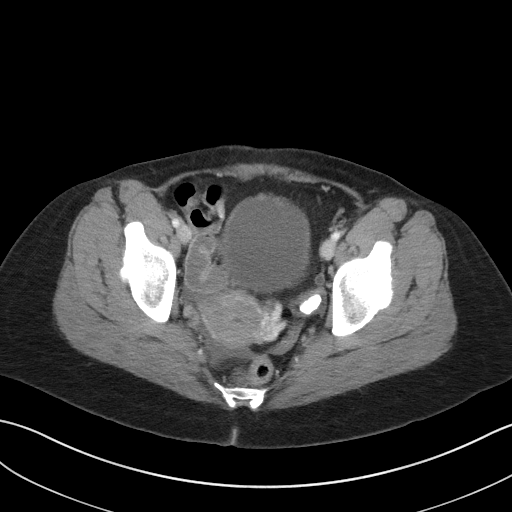
[im 31/91  soft-tissue]
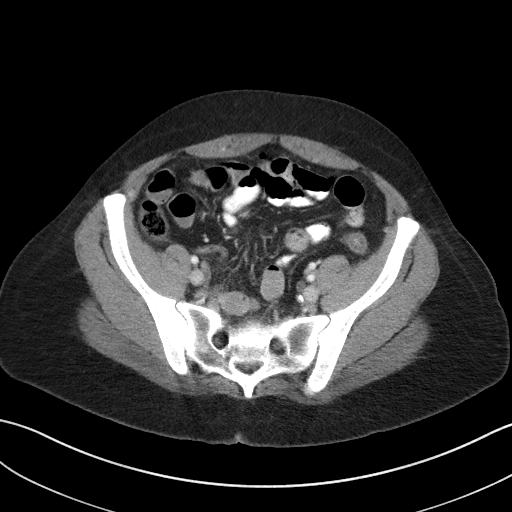
[im 36/91  soft-tissue]
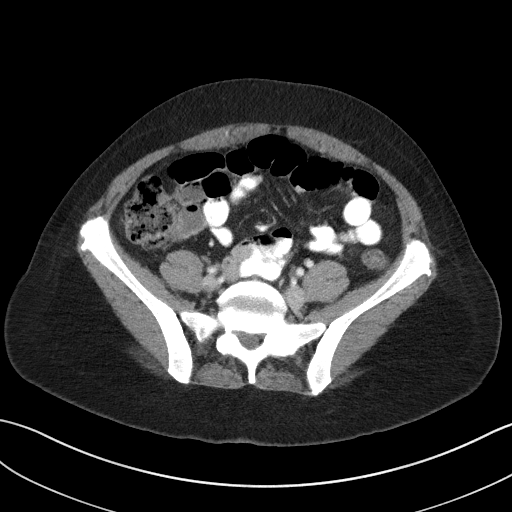
[im 46/91  soft-tissue]
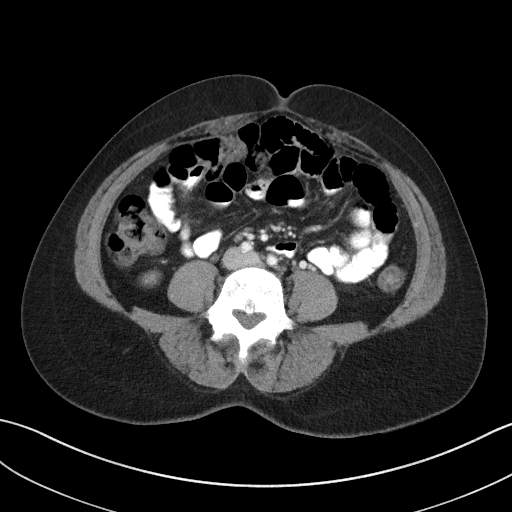
[im 56/91  soft-tissue]
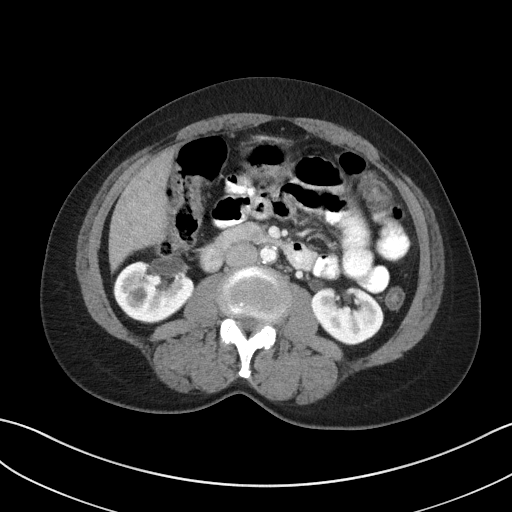
[im 61/91  soft-tissue]
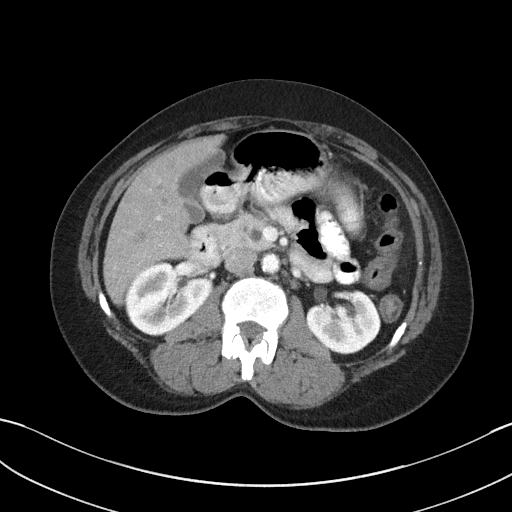
[im 71/91  soft-tissue]
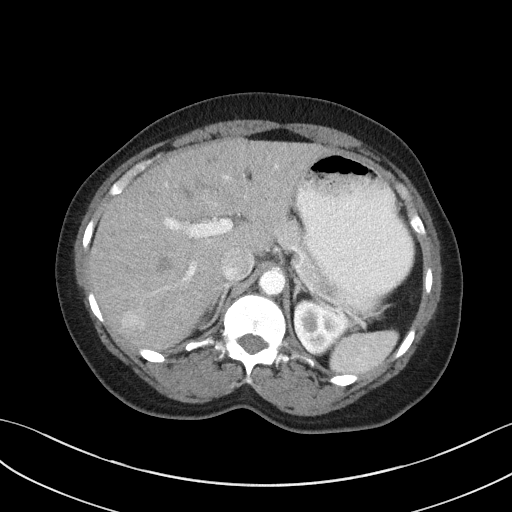
[im 71/91  bone]
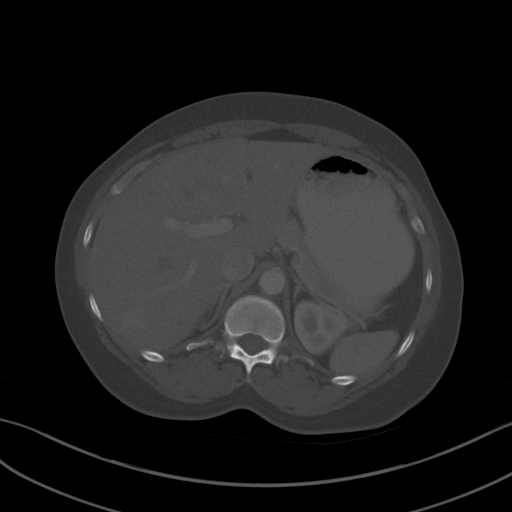
[im 76/91  soft-tissue]
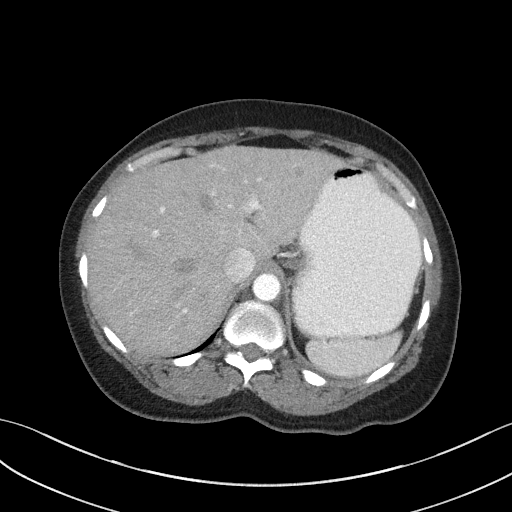
[im 86/91  soft-tissue]
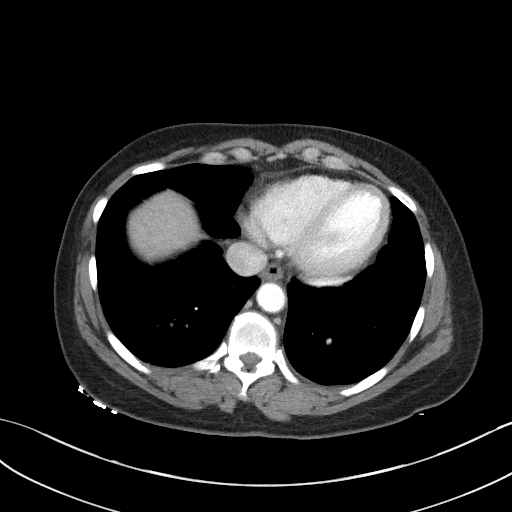

[Series 5: coronal st · coronal · 0.68mm/px · 3 of 88 slices shown]
[im 30/88  soft-tissue]
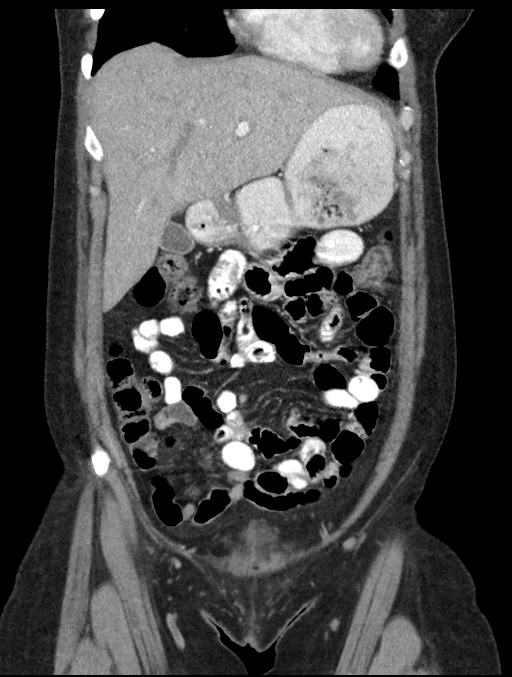
[im 39/88  soft-tissue]
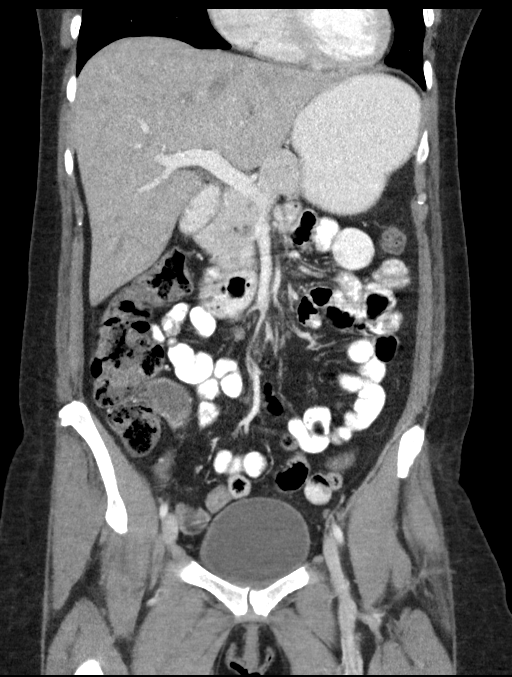
[im 49/88  soft-tissue]
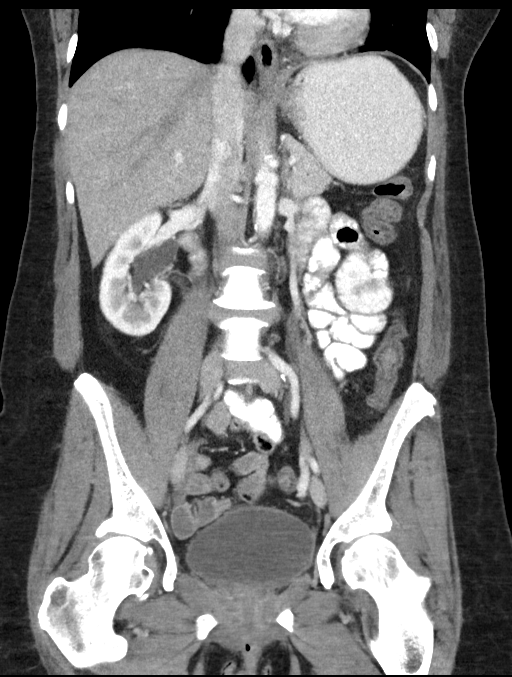

[14 of 46 positions shown; findings below may reference images not displayed]

FINDINGS: Lower chest: No acute abnormality.

Hepatobiliary: 2.3 cm segment [DATE] flash filling lesion with late
equilibration. Hypoattenuation with near the falciform ligament is
likely represent focal steatosis. No other liver lesion identified.
Normal gallbladder. No intra or extrahepatic biliary ductal
dilatation.

Pancreas: 10 mm cystic lesion in head of pancreas (series 2, image
31) probably in communication with the main pancreatic duct (series
5, image 41). No dilatation of the main pancreatic duct. No other
pancreatic lesion or inflammatory changes are identified. No
peripancreatic fluid collection or evidence for necrosis.

Spleen: Normal in size without focal abnormality.

Adrenals/Urinary Tract: Normal adrenal glands. Right kidney
interpolar 9 mm lucency is probably a cyst. No other renal lesion or
stone is identified. Mild left-sided hydronephrosis and question
minimal urothelial enhancement of the ureter (series 2, image 70).
No obstructing stone or lesion is identified. Bladder is
unremarkable. Mild right pelviectasis.

Stomach/Bowel: Stomach is within normal limits. Appendix appears
normal. No evidence of bowel wall thickening, distention, or
inflammatory changes.

Vascular/Lymphatic: Aortic atherosclerosis. No enlarged abdominal or
pelvic lymph nodes.

Reproductive: The uterus and right adnexa is normal. Dense
calcification in the left adnexa measuring up to 19 mm (series 2,
image 71).

Other: No abdominal wall hernia or abnormality. No abdominopelvic
ascites.

Musculoskeletal: No acute or significant osseous findings.
IMPRESSION: 1. No peripancreatic fluid collection or necrosis identified.
2. Mild left hydronephrosis and question urothelial enhancement
without an obstructing stone or lesion. This may represent urinary
tract infection or reflux.
3. **An incidental finding of potential clinical significance has
been found. 2.3 cm liver segment [DATE] flash filling lesion. Further
evaluation with hepatic MRI with contrast is recommended with
possible biopsy if high risk patient. This recommendation follows
ACR consensus guidelines: Managing Incidental Findings on Abdominal
CT: White Paper of the ACR Incidental Findings Committee. [HOSPITAL] 9727;[DATE]**
4. **An incidental finding of potential clinical significance has
been found. 10 mm cystic lesion within head of pancreas, no main
duct dilatation, probably a side branch IPMN, possibly a
cystadenoma. Follow-up imaging in 1 year is recommended with
pancreas protocol MRI or CT with contrast. This recommendation
follows ACR consensus guidelines: Management of Incidental
Pancreatic Cysts: A White Paper of the ACR Incidental Findings
Committee. [HOSPITAL] [id number];[DATE].**
5. Dense left adnexa calcification of uncertain significance,
possibly sequelae of prior hematoma, underlying dermoid is also
possible. This can be further characterized with pelvic ultrasound
as clinically indicated.

By: Egawa Birkia M.D.

## 2018-01-22 IMAGING — CR DG CHEST 2V
2 series · 2 of 2 positions shown · non-contrast
Comparison: None.

CLINICAL DATA: Left-sided chest pain for 3 days

EXAM:
CHEST  2 VIEW

[chest pa]
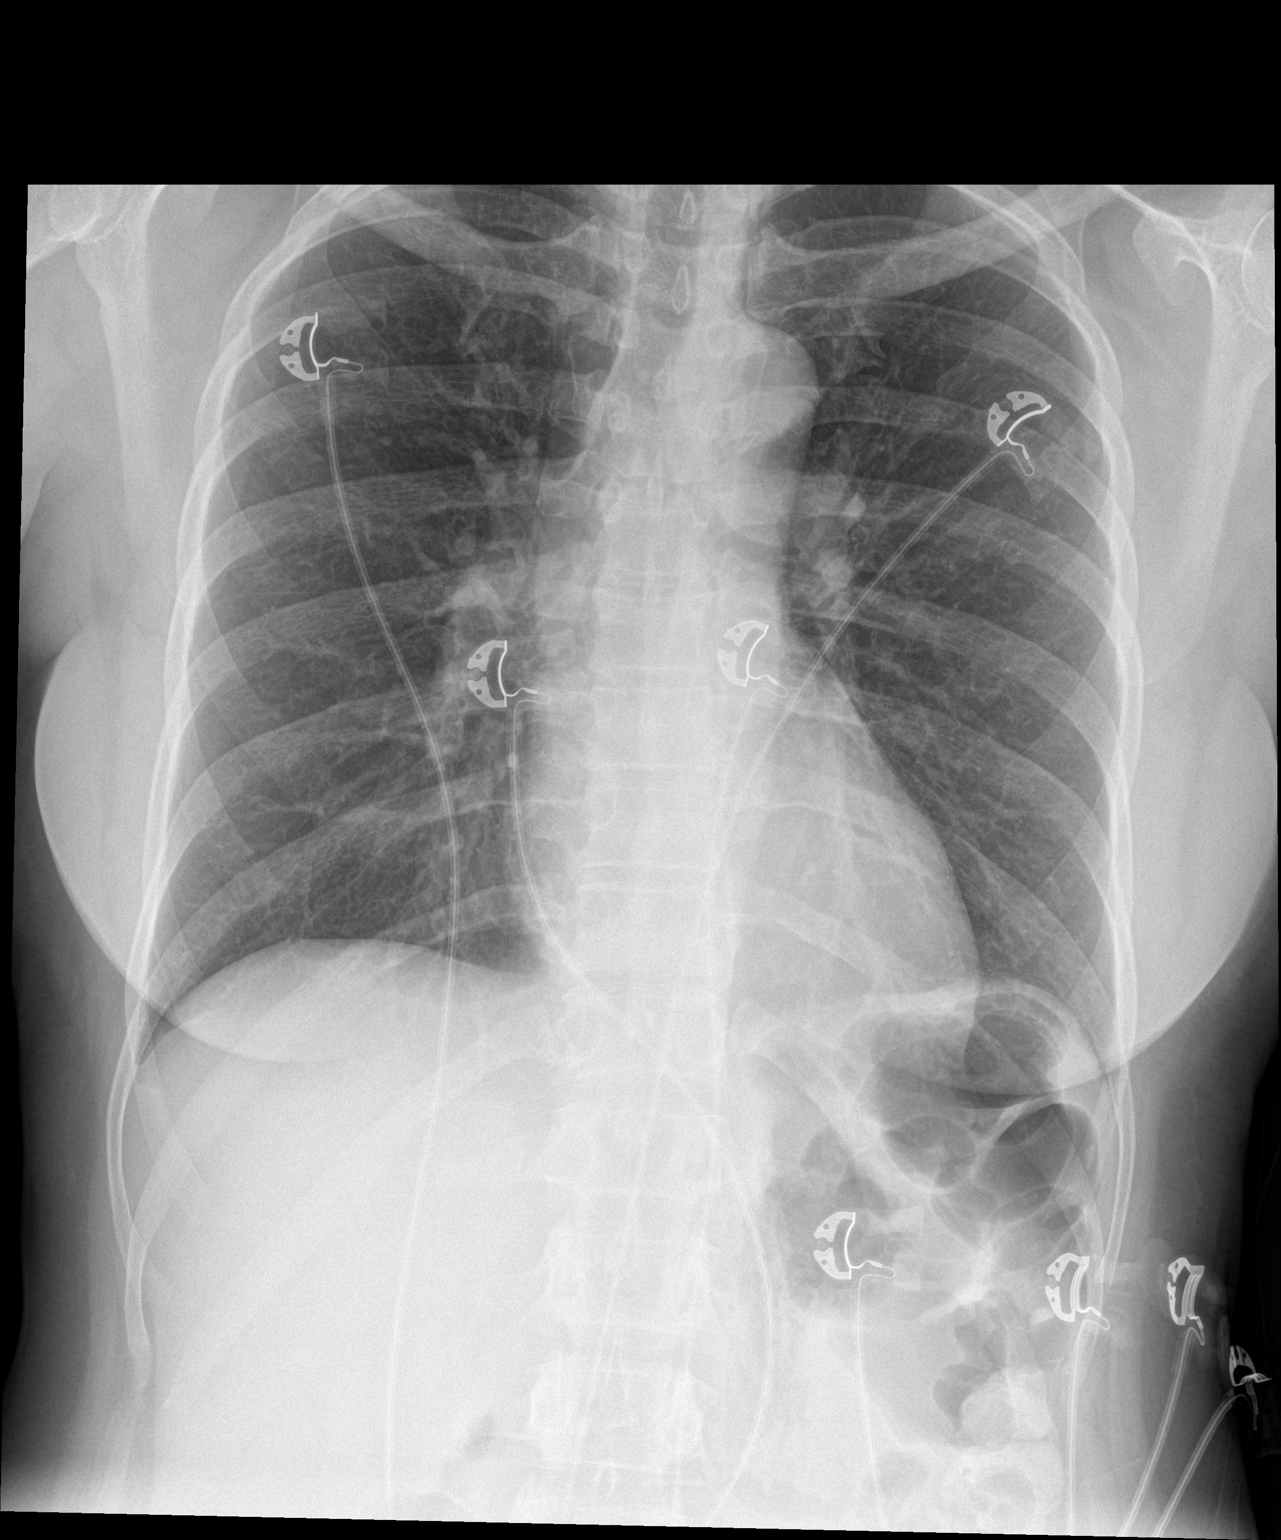

[chest lat]
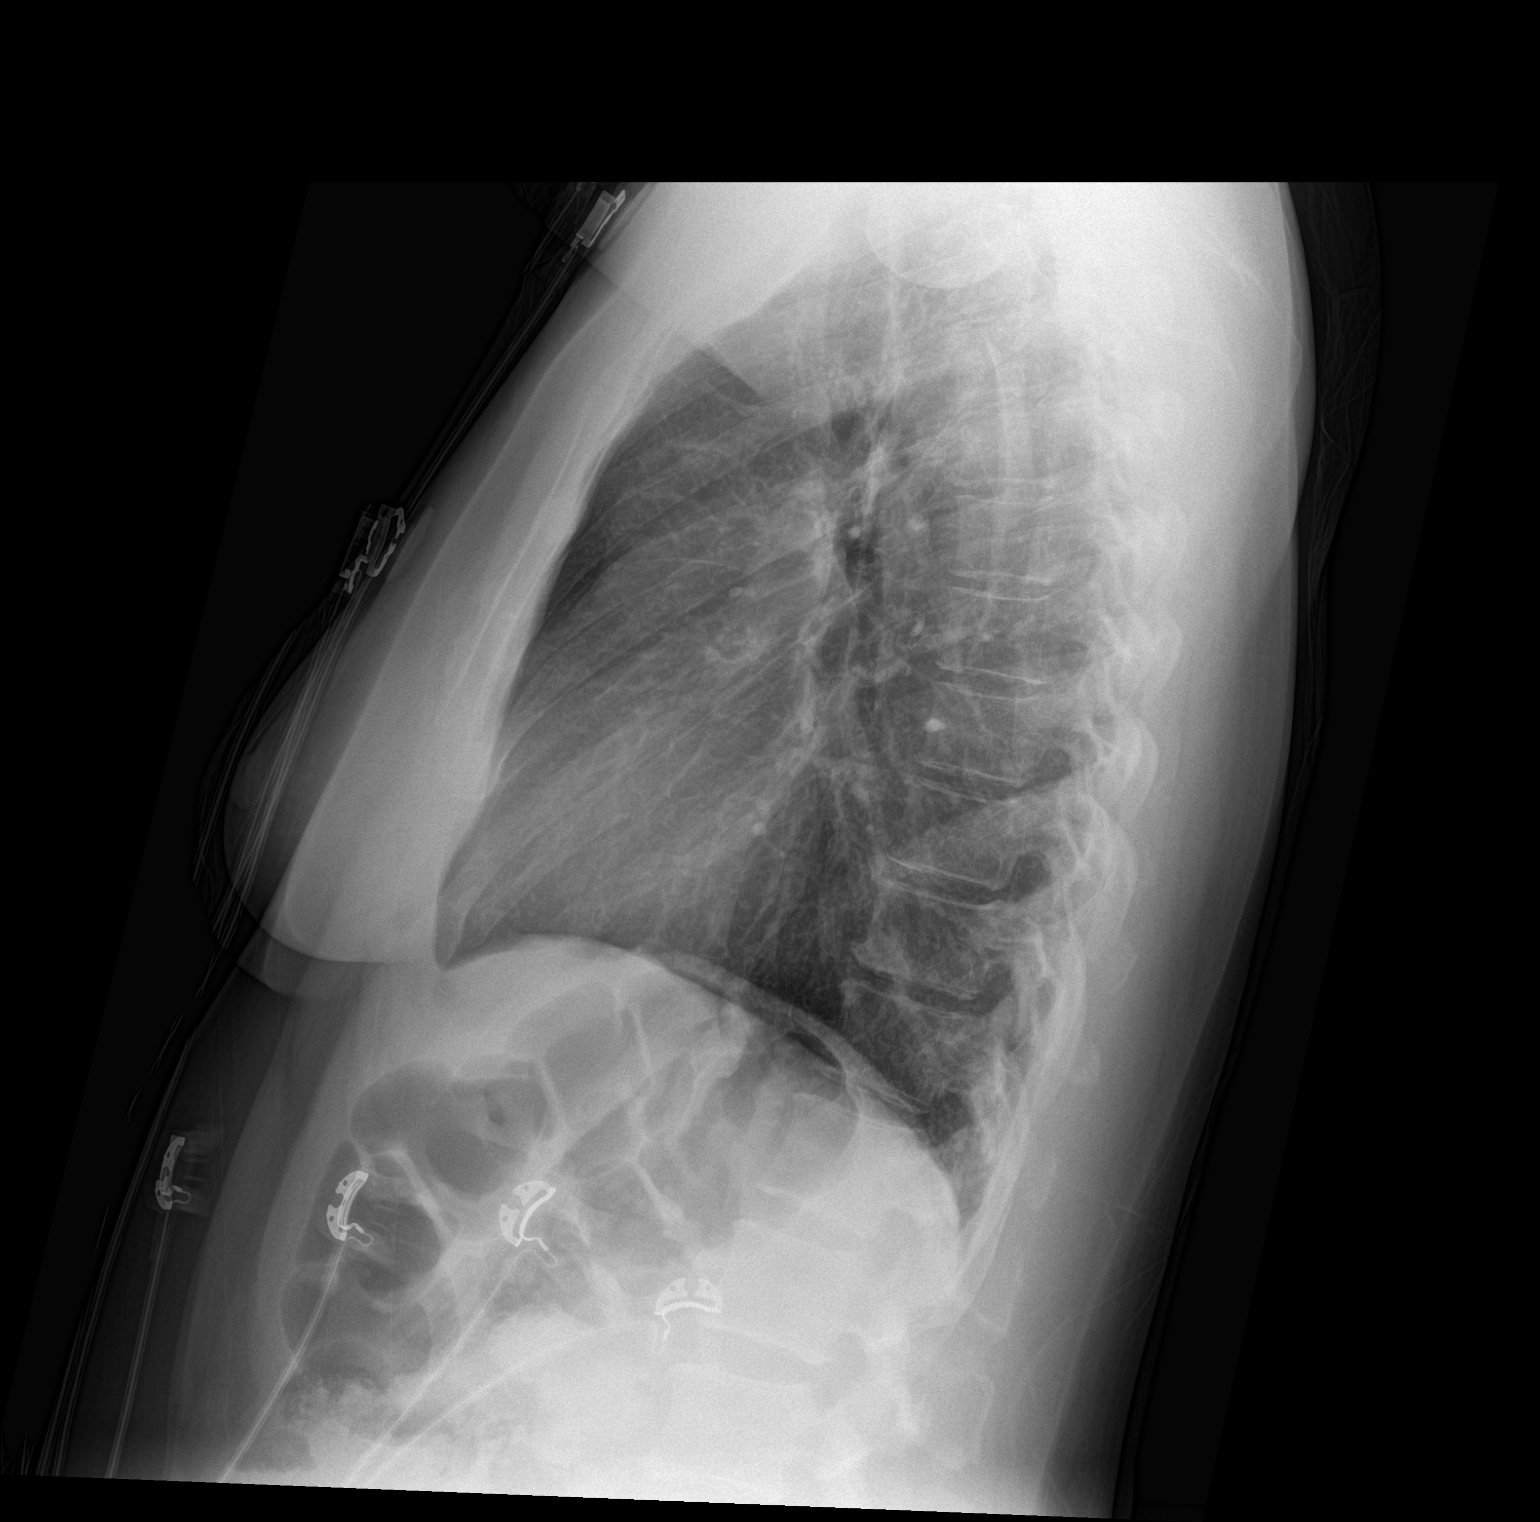

[2 of 2 positions shown; findings below may reference images not displayed]

FINDINGS: No active infiltrate or effusion is seen. Mediastinal and hilar
contours are unremarkable. The heart is within normal limits in
size. No bony abnormality is seen.
IMPRESSION: No active cardiopulmonary disease.

## 2019-07-27 ENCOUNTER — Other Ambulatory Visit: Payer: Self-pay

## 2019-07-27 ENCOUNTER — Emergency Department: Payer: Self-pay

## 2019-07-27 ENCOUNTER — Emergency Department
Admission: EM | Admit: 2019-07-27 | Discharge: 2019-07-27 | Disposition: A | Discharge status: Home / Self Care | Payer: Self-pay | Attending: Student | Admitting: Student

## 2019-07-27 ENCOUNTER — Encounter: Payer: Self-pay | Admitting: Emergency Medicine

## 2019-07-27 DIAGNOSIS — R030 Elevated blood-pressure reading, without diagnosis of hypertension: Secondary | ICD-10-CM

## 2019-07-27 DIAGNOSIS — M47812 Spondylosis without myelopathy or radiculopathy, cervical region: Secondary | ICD-10-CM

## 2019-07-27 DIAGNOSIS — I10 Essential (primary) hypertension: Secondary | ICD-10-CM | POA: Insufficient documentation

## 2019-07-27 DIAGNOSIS — Z79899 Other long term (current) drug therapy: Secondary | ICD-10-CM | POA: Insufficient documentation

## 2019-07-27 DIAGNOSIS — M79603 Pain in arm, unspecified: Secondary | ICD-10-CM

## 2019-07-27 DIAGNOSIS — M25531 Pain in right wrist: Secondary | ICD-10-CM

## 2019-07-27 DIAGNOSIS — M79631 Pain in right forearm: Secondary | ICD-10-CM | POA: Insufficient documentation

## 2019-07-27 DIAGNOSIS — R519 Headache, unspecified: Secondary | ICD-10-CM | POA: Insufficient documentation

## 2019-07-27 DIAGNOSIS — M5020 Other cervical disc displacement, unspecified cervical region: Secondary | ICD-10-CM

## 2019-07-27 LAB — COMPREHENSIVE METABOLIC PANEL WITH GFR
ALT: 18 U/L (ref 0–44)
AST: 18 U/L (ref 15–41)
Albumin: 4.4 g/dL (ref 3.5–5.0)
Alkaline Phosphatase: 76 U/L (ref 38–126)
Anion gap: 10 (ref 5–15)
BUN: 15 mg/dL (ref 6–20)
CO2: 25 mmol/L (ref 22–32)
Calcium: 9.9 mg/dL (ref 8.9–10.3)
Chloride: 103 mmol/L (ref 98–111)
Creatinine, Ser: 0.51 mg/dL (ref 0.44–1.00)
GFR calc Af Amer: >60 mL/min
GFR calc non Af Amer: >60 mL/min
Glucose, Bld: 95 mg/dL (ref 70–99)
Potassium: 4 mmol/L (ref 3.5–5.1)
Sodium: 138 mmol/L (ref 135–145)
Total Bilirubin: 0.7 mg/dL (ref 0.3–1.2)
Total Protein: 8.1 g/dL (ref 6.5–8.1)

## 2019-07-27 LAB — CBC WITH DIFFERENTIAL/PLATELET
Abs Immature Granulocytes: 0.01 10*3/uL (ref 0.00–0.07)
Basophils Absolute: 0.1 10*3/uL (ref 0.0–0.1)
Basophils Relative: 1 %
Eosinophils Absolute: 0.1 10*3/uL (ref 0.0–0.5)
Eosinophils Relative: 2 %
HCT: 42.5 % (ref 36.0–46.0)
Hemoglobin: 13.7 g/dL (ref 12.0–15.0)
Immature Granulocytes: 0 %
Lymphocytes Relative: 28 %
Lymphs Abs: 2 10*3/uL (ref 0.7–4.0)
MCH: 29.9 pg (ref 26.0–34.0)
MCHC: 32.2 g/dL (ref 30.0–36.0)
MCV: 92.8 fL (ref 80.0–100.0)
Monocytes Absolute: 0.5 10*3/uL (ref 0.1–1.0)
Monocytes Relative: 7 %
Neutro Abs: 4.4 10*3/uL (ref 1.7–7.7)
Neutrophils Relative %: 62 %
Platelets: 250 10*3/uL (ref 150–400)
RBC: 4.58 MIL/uL (ref 3.87–5.11)
RDW: 14.5 % (ref 11.5–15.5)
WBC: 7.1 10*3/uL (ref 4.0–10.5)
nRBC: 0 % (ref 0.0–0.2)

## 2019-07-27 LAB — URIC ACID: Uric Acid, Serum: 4.5 mg/dL (ref 2.5–7.1)

## 2019-07-27 LAB — C-REACTIVE PROTEIN: CRP: <0.5 mg/dL (ref <1.0)

## 2019-07-27 LAB — PROTIME-INR
INR: 0.9 (ref 0.8–1.2)
Prothrombin Time: 12.1 s (ref 11.4–15.2)

## 2019-07-27 LAB — SEDIMENTATION RATE: Sed Rate: 7 mm/h (ref 0–30)

## 2019-07-27 MED ORDER — AMLODIPINE BESYLATE 5 MG PO TABS
5.0000 mg | ORAL_TABLET | Freq: Once | ORAL | Status: AC
  Administered 2019-07-27: 5 mg via ORAL
  Filled 2019-07-27: qty 1

## 2019-07-27 MED ORDER — OXYCODONE-ACETAMINOPHEN 5-325 MG PO TABS: 1.0000 | ORAL_TABLET | Freq: Four times a day (QID) | ORAL | 0 refills | Status: AC | PRN

## 2019-07-27 MED ORDER — IBUPROFEN 600 MG PO TABS
600.0000 mg | ORAL_TABLET | Freq: Once | ORAL | Status: AC
  Administered 2019-07-27: 16:00:00 600 mg via ORAL
  Filled 2019-07-27: qty 1

## 2019-07-27 MED ORDER — IBUPROFEN 600 MG PO TABS: 600.0000 mg | ORAL_TABLET | Freq: Four times a day (QID) | ORAL | 0 refills | Status: AC | PRN

## 2019-07-27 MED ORDER — OXYCODONE-ACETAMINOPHEN 5-325 MG PO TABS
1.0000 | ORAL_TABLET | Freq: Once | ORAL | Status: AC
  Administered 2019-07-27: 1 via ORAL
  Filled 2019-07-27: qty 1

## 2019-07-27 MED ORDER — AMLODIPINE BESYLATE 5 MG PO TABS: 5.0000 mg | ORAL_TABLET | Freq: Every day | ORAL | 1 refills | Status: DC

## 2019-07-27 NOTE — Discharge Instructions (Addendum)
Your x-rays were normal.  Your MRI shows that you have some arthritis and some disc protrusions in your spine.  Your symptoms may be due to this or the may be due to inflammation in your wrist.  Please follow-up with neurosurgery and Derm or Pulcifer for this.  Please wear your wrist splint to your right wrist.  Take ibuprofen for pain and inflammation and Percocet for extreme pain.  Your blood pressure has been elevated while in the emergency department.  Please begin amlodipine and follow-up with primary care for recheck.

## 2019-07-27 NOTE — ED Triage Notes (Signed)
Pt to ER states she is having pain in her right arm.  States pain feels like a cramp, but it hurts when it she moves it.  States it started last night when when she was watching TV and then woke her during the night.  Pt denies known injury.  Pt states pain is mostly from elbow down, but she also feels it in the entire arm.

## 2019-07-27 NOTE — ED Provider Notes (Signed)
[facility] Emergency Department Provider Note  ____________________________________________  Time seen: Approximately 10:32 AM  I have reviewed the triage vital signs and the nursing notes.   HISTORY  Chief Complaint Arm Pain    HPI Elizabeth Chambers is a 59 y.o. female that presents to the emergency department for evaluation of right forearm and wrist pain that started last night.  Patient states that pain started last night while she was watching TV.  She felt a sharp pain to her right forearm.  Pain woke her in the middle of the night.  She has some numbness to the inside of her wrist.  She also has sharp and cramping pains and pains that shoot up her forearm. Pain does radiate into her upper arm but she did have some shoulder discomfort earlier.  She feels like she cannot lift her wrist up.  No known injury.  Patient smokes marijuana occasionally but no IV drug use.  No history of gout.  Patient has a history of hypertension and was started on hydrochlorothiazide years ago but was taken off of it.  No headache, shortness of breath, chest pain.  Past Medical History:  Diagnosis Date  . Hypertension     There are no problems to display for this patient.   Past Surgical History:  Procedure Laterality Date  . UPPER ESOPHAGEAL ENDOSCOPIC ULTRASOUND (EUS) N/A 03/03/2016   Procedure: UPPER ESOPHAGEAL ENDOSCOPIC ULTRASOUND (EUS);  Surgeon: Reita Cliche, MD;  Location: [facility] ENDOSCOPY;  Service: Gastroenterology;  Laterality: N/A;    Prior to Admission medications   Medication Sig Start Date End Date Taking? Authorizing Provider  acetaminophen (TYLENOL) 500 MG tablet Take 500 mg by mouth every 6 (six) hours as needed.    [provider]  amLODipine (NORVASC) 5 MG tablet Take 1 tablet (5 mg total) by mouth daily. 07/27/19 07/26/20  Laban Emperor, PA-C  hydrochlorothiazide (MICROZIDE) 12.5 MG capsule Take 1 capsule (12.5 mg total) by mouth daily. 01/27/16  02/26/16  Joanne Gavel, MD  ibuprofen (ADVIL) 600 MG tablet Take 1 tablet (600 mg total) by mouth every 6 (six) hours as needed. 07/27/19   Laban Emperor, PA-C  methocarbamol (ROBAXIN-750) 750 MG tablet Take 1 tablet (750 mg total) by mouth 4 (four) times daily. 02/11/17   Sable Feil, PA-C  oxyCODONE-acetaminophen (PERCOCET) 5-325 MG tablet Take 1 tablet by mouth every 6 (six) hours as needed for severe pain. 07/27/19 07/26/20  Laban Emperor, PA-C  traMADol (ULTRAM) 50 MG tablet Take 1 tablet (50 mg total) by mouth every 12 (twelve) hours as needed. 02/08/17   Sable Feil, PA-C    Allergies Patient has no known allergies.  History reviewed. No pertinent family history.  Social History Social History   Tobacco Use  . Smoking status: Never Smoker  . Smokeless tobacco: Never Used  . Tobacco comment: marijuana  Substance Use Topics  . Alcohol use: No  . Drug use: No    Frequency: 2.0 times per week     Review of Systems  Cardiovascular: No chest pain. Respiratory: No SOB. Gastrointestinal: No abdominal pain.  No nausea, no vomiting.  Musculoskeletal: Positive for wrist and forearm pain. Skin: Negative for rash, abrasions, lacerations, ecchymosis. Neurological: Negative for headaches, or tingling.  Positive for numbness.   ____________________________________________   PHYSICAL EXAM:  VITAL SIGNS: ED Triage Vitals  Enc Vitals Group     BP 07/27/19 0925 (!) 192/96     Pulse Rate 07/27/19 0925 64  Resp 07/27/19 0925 20     Temp 07/27/19 0925 98.4 F (36.9 C)     Temp Source 07/27/19 0925 Oral     SpO2 07/27/19 0925 100 %     Weight 07/27/19 0926 170 lb (77.1 kg)     Height 07/27/19 0926 5\' 6"  (1.676 m)     Head Circumference --      Peak Flow --      Pain Score 07/27/19 0936 10     Pain Loc --      Pain Edu? --      Excl. in [place]? --      Constitutional: Alert and oriented. Well appearing and in no acute distress. Eyes: Conjunctivae are normal. PERRL.  EOMI. Head: Atraumatic. ENT:      Ears:      Nose: No congestion/rhinnorhea.      Mouth/Throat: Mucous membranes are moist.  Neck: No stridor.  No cervical spine tenderness to palpation. Cardiovascular: Normal rate, regular rhythm.  Good peripheral circulation. Symmetric radial pulses bilaterally. Respiratory: Normal respiratory effort without tachypnea or retractions. Lungs CTAB. Good air entry to the bases with no decreased or absent breath sounds. Gastrointestinal: Bowel sounds 4 quadrants. Soft and nontender to palpation. No guarding or rigidity. No palpable masses. No distention.  Musculoskeletal: No gross deformities appreciated.  Mild swelling to dorsal right wrist.  Unable to actively extend right wrist.  Able to actively flex right wrist.  Full range of motion of fingers.  Patient elicits a change in sensation to dorsal hand.  Tenderness to palpation to medial right wrist and medial right distal distal forearm. No triceps or biceps tenderness or weakness.  Neurologic:  Normal speech and language. No gross focal neurologic deficits are appreciated.  Skin:  Skin is warm, dry and intact. No rash noted. Psychiatric: Mood and affect are normal. Speech and behavior are normal. Patient exhibits appropriate insight and judgement.   ____________________________________________   LABS (all labs ordered are listed, but only abnormal results are displayed)  Labs Reviewed  CBC WITH DIFFERENTIAL/PLATELET  COMPREHENSIVE METABOLIC PANEL  SEDIMENTATION RATE  URIC ACID  PROTIME-INR  C-REACTIVE PROTEIN   ____________________________________________  EKG   ____________________________________________  RADIOLOGY Robinette Haines, personally viewed and evaluated these images (plain radiographs) as part of my medical decision making, as well as reviewing the written report by the radiologist.  DG Forearm Right  Result Date: 07/27/2019 CLINICAL DATA:  RIGHT arm pain.  Wrist drop. EXAM:  RIGHT FOREARM - 2 VIEW COMPARISON:  None. FINDINGS: There is no evidence of fracture or other focal bone lesions. No appreciable degenerative change at the RIGHT elbow or wrist. Soft tissues are unremarkable. IMPRESSION: Negative. Electronically Signed   By: Franki Cabot M.D.   On: 07/27/2019 11:00   CT Head Wo Contrast  Result Date: 07/27/2019 CLINICAL DATA:  Headache with hypertension; right upper extremity radicular symptoms EXAM: CT HEAD WITHOUT CONTRAST CT CERVICAL SPINE WITHOUT CONTRAST TECHNIQUE: Multidetector CT imaging of the head and cervical spine was performed following the standard protocol without intravenous contrast. Multiplanar CT image reconstructions of the cervical spine were also generated. COMPARISON:  None. FINDINGS: CT HEAD FINDINGS Brain: Ventricles and sulci are normal in size and configuration. Mild prominence of the cisterna magna is an anatomic variant. There is no intracranial mass, hemorrhage, extra-axial fluid collection, or midline shift. The brain parenchyma appears unremarkable. No evident acute infarct. Vascular: Hyperdense vessel. There is calcification in each carotid siphon region. Skull: The bony calvarium appears intact.  Sinuses/Orbits: There is mucosal thickening in several ethmoid air cells. Other visualized paranasal sinuses are clear. Visualized orbits appear symmetric bilaterally. Other: Mastoid air cells are clear. CT CERVICAL SPINE FINDINGS Alignment: There is no appreciable spondylolisthesis. Skull base and vertebrae: Skull base and craniocervical junction regions appear normal. No evident fracture. No blastic or lytic bone lesions. Soft tissues and spinal canal: Prevertebral soft tissues and predental space regions are normal. There is no cord or canal hematoma. No paraspinous lesion. Disc levels: There is moderate disc space narrowing at C5-6. There is slight disc space narrowing at C3-4. At C2-3, there is slight facet hypertrophy bilaterally. There is no disc  extrusion or stenosis. At C3-4, there is facet hypertrophy bilaterally with impression on the exiting nerve roots bilaterally. No disc extrusion or frank stenosis at this level. There is slight central disc bulging which does not efface the cord. At C4-5, there is relatively mild facet hypertrophy bilaterally. No nerve root edema or effacement. No disc extrusion or stenosis. At C5-6, there is moderate facet hypertrophy bilaterally. There is exit foraminal narrowing bilaterally at this level, slightly more severe on the left than on the right with impression on exiting nerve roots. No for disc extrusion or stenosis evident. At C6-7, there is mild facet hypertrophy bilaterally. No nerve root edema or effacement. No disc extrusion or stenosis. At C7-T1, there is mild facet hypertrophy bilaterally. No nerve root edema or effacement. No disc extrusion or stenosis. Upper chest: There is mild scarring in the extreme apices. Visualized upper lung regions elsewhere are clear. Other: There is slight calcification in the left carotid artery. IMPRESSION: CT head: Normal appearing brain parenchyma.  No mass or hemorrhage. There are foci of arterial vascular calcification. There is mucosal thickening in several ethmoid air cells. CT cervical spine: 1.  No fracture or spondylolisthesis. 2. Facet hypertrophy at multiple levels. Facet hypertrophy is most severe at C3-4 and C5-6 bilaterally with impression on exiting nerve roots due to facet hypertrophy in these areas. Disc space narrowing is most notable at C5-6 and to a slightly lesser degree at C3-4. 3.  No frank disc extrusion or stenosis. 4.  Slight calcification in the left carotid artery. Electronically Signed   By: Lowella Grip III M.D.   On: 07/27/2019 10:57   CT Cervical Spine Wo Contrast  Result Date: 07/27/2019 CLINICAL DATA:  Headache with hypertension; right upper extremity radicular symptoms EXAM: CT HEAD WITHOUT CONTRAST CT CERVICAL SPINE WITHOUT CONTRAST  TECHNIQUE: Multidetector CT imaging of the head and cervical spine was performed following the standard protocol without intravenous contrast. Multiplanar CT image reconstructions of the cervical spine were also generated. COMPARISON:  None. FINDINGS: CT HEAD FINDINGS Brain: Ventricles and sulci are normal in size and configuration. Mild prominence of the cisterna magna is an anatomic variant. There is no intracranial mass, hemorrhage, extra-axial fluid collection, or midline shift. The brain parenchyma appears unremarkable. No evident acute infarct. Vascular: Hyperdense vessel. There is calcification in each carotid siphon region. Skull: The bony calvarium appears intact. Sinuses/Orbits: There is mucosal thickening in several ethmoid air cells. Other visualized paranasal sinuses are clear. Visualized orbits appear symmetric bilaterally. Other: Mastoid air cells are clear. CT CERVICAL SPINE FINDINGS Alignment: There is no appreciable spondylolisthesis. Skull base and vertebrae: Skull base and craniocervical junction regions appear normal. No evident fracture. No blastic or lytic bone lesions. Soft tissues and spinal canal: Prevertebral soft tissues and predental space regions are normal. There is no cord or canal hematoma. No paraspinous  lesion. Disc levels: There is moderate disc space narrowing at C5-6. There is slight disc space narrowing at C3-4. At C2-3, there is slight facet hypertrophy bilaterally. There is no disc extrusion or stenosis. At C3-4, there is facet hypertrophy bilaterally with impression on the exiting nerve roots bilaterally. No disc extrusion or frank stenosis at this level. There is slight central disc bulging which does not efface the cord. At C4-5, there is relatively mild facet hypertrophy bilaterally. No nerve root edema or effacement. No disc extrusion or stenosis. At C5-6, there is moderate facet hypertrophy bilaterally. There is exit foraminal narrowing bilaterally at this level,  slightly more severe on the left than on the right with impression on exiting nerve roots. No for disc extrusion or stenosis evident. At C6-7, there is mild facet hypertrophy bilaterally. No nerve root edema or effacement. No disc extrusion or stenosis. At C7-T1, there is mild facet hypertrophy bilaterally. No nerve root edema or effacement. No disc extrusion or stenosis. Upper chest: There is mild scarring in the extreme apices. Visualized upper lung regions elsewhere are clear. Other: There is slight calcification in the left carotid artery. IMPRESSION: CT head: Normal appearing brain parenchyma.  No mass or hemorrhage. There are foci of arterial vascular calcification. There is mucosal thickening in several ethmoid air cells. CT cervical spine: 1.  No fracture or spondylolisthesis. 2. Facet hypertrophy at multiple levels. Facet hypertrophy is most severe at C3-4 and C5-6 bilaterally with impression on exiting nerve roots due to facet hypertrophy in these areas. Disc space narrowing is most notable at C5-6 and to a slightly lesser degree at C3-4. 3.  No frank disc extrusion or stenosis. 4.  Slight calcification in the left carotid artery. Electronically Signed   By: Lowella Grip III M.D.   On: 07/27/2019 10:57   MR Cervical Spine Wo Contrast  Result Date: 07/27/2019 CLINICAL DATA:  Cervical radiculopathy on the right EXAM: MRI CERVICAL SPINE WITHOUT CONTRAST TECHNIQUE: Multiplanar, multisequence MR imaging of the cervical spine was performed. No intravenous contrast was administered. COMPARISON:  None. FINDINGS: Alignment: Straightening of the cervical spine. Vertebrae: Mild discogenic marrow edema at C3-4 and C5-6. Cord: Degenerative cord deformity at C3-4, C5-6, and C6-7 Posterior Fossa, vertebral arteries, paraspinal tissues: Minimal dorsal lateral corda signal abnormality at the level of C5-6, presumably post compressive. No cord swelling Disc levels: C2-3: Uncovertebral spurring asymmetric to the left  with moderate left foraminal narrowing C3-4: Disc narrowing and biforaminal predominant bulging with uncovertebral spurring. Biforaminal impingement is advanced. A central disc protrusion flattens the ventral cord. C4-5: Disc narrowing with bulging and uncovertebral ridging. Moderate left more than right foraminal narrowing. C5-6: Disc narrowing and bulging with biforaminal protrusion and bulky uncovertebral ridging. Advanced biforaminal stenosis. Spinal stenosis with cord flattening and dorsal cord signal abnormality as above C6-7: Disc narrowing and bulging with a central protrusion flattening the ventral cord. Moderate bilateral foraminal narrowing C7-T1:Unremarkable. IMPRESSION: 1. On the symptomatic right side there is advanced foraminal impingement due to degenerative disease at C3-4 and C5-6. Right foraminal narrowing is moderate at C4-5 and C6-7. 2. Left foraminal impingement at C2-3 to C6-7, severe at C3-4 and C5-6. 3. Disc protrusions flatten the cord at C3-4, C5-6, and C6-7. Mild dorsal cord signal abnormality at C5-6. Electronically Signed   By: Monte Fantasia M.D.   On: 07/27/2019 14:16   US Venous Img Upper Uni Right(DVT)  Result Date: 07/27/2019 CLINICAL DATA:  Right arm pain. EXAM: RIGHT UPPER EXTREMITY VENOUS DOPPLER ULTRASOUND TECHNIQUE: Gray-scale  sonography with graded compression, as well as color Doppler and duplex ultrasound were performed to evaluate the upper extremity deep venous system from the level of the subclavian vein and including the jugular, axillary, basilic, radial, ulnar and upper cephalic vein. Spectral Doppler was utilized to evaluate flow at rest and with distal augmentation maneuvers. COMPARISON:  None. FINDINGS: Contralateral Subclavian Vein: Respiratory phasicity is normal and symmetric with the symptomatic side. No evidence of thrombus. Normal compressibility. Internal Jugular Vein: No evidence of thrombus. Normal compressibility, respiratory phasicity and response to  augmentation. Subclavian Vein: No evidence of thrombus. Normal compressibility, respiratory phasicity and response to augmentation. Axillary Vein: No evidence of thrombus. Normal compressibility, respiratory phasicity and response to augmentation. Cephalic Vein: Unable to see distal cephalic vein. No evidence of thrombus as visualized. Normal compressibility, respiratory phasicity and response to augmentation. Basilic Vein: No evidence of thrombus. Normal compressibility, respiratory phasicity and response to augmentation. Brachial Veins: No evidence of thrombus. Normal compressibility, respiratory phasicity and response to augmentation. Radial Veins: No evidence of thrombus. Normal compressibility, respiratory phasicity and response to augmentation. Ulnar Veins: No evidence of thrombus. Normal compressibility, respiratory phasicity and response to augmentation. Venous Reflux:  None visualized. Other Findings:  None visualized. IMPRESSION: No evidence of DVT within the right upper extremity. Suboptimal visualization of the distal cephalic vein. Electronically Signed   By: Fidela Salisbury M.D.   On: 07/27/2019 12:35   DG Humerus Right  Result Date: 07/27/2019 CLINICAL DATA:  RIGHT arm pain. EXAM: RIGHT HUMERUS - 2+ VIEW COMPARISON:  None. FINDINGS: There is no evidence of fracture or other focal bone lesions. No appreciable degenerative change at the RIGHT shoulder. Soft tissues are unremarkable. IMPRESSION: Negative. Electronically Signed   By: Franki Cabot M.D.   On: 07/27/2019 10:59    ____________________________________________    PROCEDURES  Procedure(s) performed:    Procedures    Medications  oxyCODONE-acetaminophen (PERCOCET/ROXICET) 5-325 MG per tablet 1 tablet (1 tablet Oral Given 07/27/19 1107)  amLODipine (NORVASC) tablet 5 mg (5 mg Oral Given 07/27/19 1459)  ibuprofen (ADVIL) tablet 600 mg (600 mg Oral Given 07/27/19 1600)      ____________________________________________   INITIAL IMPRESSION / ASSESSMENT AND PLAN / ED COURSE  Pertinent labs & imaging results that were available during my care of the patient were reviewed by me and considered in my medical decision making (see chart for details).  Review of the [place] CSRS was performed in accordance of the [place] prior to dispensing any controlled drugs.  Patient presented to the emergency department for evaluation of right wrist pain that radiates into right forearm and the inability to extend right wrist today.  Patient denies any trauma.  Low suspicion for infection as patient is able to easily flex her wrist, does not have any leukocytosis and does not use any IV drugs or have any wound..  No history of gout, and uric acid is not elevated.  Patient does have some tenderness and swelling to the dorsal right wrist.  Patient is a poor historian and she acknowledges that her wrist has been painful but is unable to determine whether she is unable to extend her wrist due to pain or due to weakness.  Patient states that it feels like her wrist will "not go." X-rays and CT scans were ordered to further evaluate.  Forearm and humerus x-ray are negative for acute abnormalities and CT cervical spine shows osteoarthritic changes.  Patient states that pain and symptoms have improved following Percocet but she  still feels that she cannot extend her right wrist.  MRI was ordered to further evaluate and shows there is advanced foraminal impingement due to degenerative disease at C3-C4 and C5-C6.  Right foraminal narrowing is moderate at C4-C5 and C6-C7.  It also shows disc protrusions flatten the cord at C3-C4, C4-C5, and C6-C7 with mild dorsal cord signal abnormality at C5-C6.  MRI and exam was discussed with Dr. Nikki Dom with neurosurgery at [facility].  He reviewed MRI results and feel that her MRI results are not consistent with her symptoms.  He feels that she is stable for outpatient  follow-up with himself or one of his colleagues that see patients in [place].  Symptoms may be related to inflammation.  Patient was given ibuprofen for inflammation.  Patient was given amlodipine for her elevated blood pressure.  Patient will be discharged home with prescriptions for ibuprofen, amlodipine, a short course of Percocet.  Wrist splint was placed.  Patient is to follow up with primary care and neurosurgery as directed. Patient is given ED precautions to return to the ED for any worsening or new symptoms.   Bowen Bonton was evaluated in Emergency Department on 07/27/2019 for the symptoms described in the history of present illness. She was evaluated in the context of the global COVID-19 pandemic, which necessitated consideration that the patient might be at risk for infection with the SARS-CoV-2 virus that causes COVID-19. Institutional protocols and algorithms that pertain to the evaluation of patients at risk for COVID-19 are in a state of rapid change based on information released by regulatory bodies including the CDC and federal and state organizations. These policies and algorithms were followed during the patient's care in the ED.  ____________________________________________  FINAL CLINICAL IMPRESSION(S) / ED DIAGNOSES  Final diagnoses:  Arm pain  Right wrist pain  Elevated blood pressure reading  Protrusion of cervical intervertebral disc  Osteoarthritis of cervical spine, unspecified spinal osteoarthritis complication status      NEW MEDICATIONS STARTED DURING THIS VISIT:  ED Discharge Orders         Ordered    amLODipine (NORVASC) 5 MG tablet  Daily     07/27/19 1551    ibuprofen (ADVIL) 600 MG tablet  Every 6 hours PRN     07/27/19 1551    oxyCODONE-acetaminophen (PERCOCET) 5-325 MG tablet  Every 6 hours PRN     07/27/19 1551              This chart was dictated using voice recognition software/Dragon. Despite best efforts to proofread, errors can occur  which can change the meaning. Any change was purely unintentional.    Laban Emperor, PA-C 07/27/19 1621    Lilia Pro., MD 07/27/19 1816

## 2020-08-20 ENCOUNTER — Emergency Department
Admission: EM | Admit: 2020-08-20 | Discharge: 2020-08-20 | Disposition: A | Discharge status: Home / Self Care | Payer: BLUE CROSS/BLUE SHIELD | Attending: Emergency Medicine | Admitting: Emergency Medicine

## 2020-08-20 ENCOUNTER — Other Ambulatory Visit: Payer: Self-pay

## 2020-08-20 ENCOUNTER — Encounter: Payer: Self-pay | Admitting: Emergency Medicine

## 2020-08-20 ENCOUNTER — Emergency Department: Payer: BLUE CROSS/BLUE SHIELD

## 2020-08-20 DIAGNOSIS — Z20822 Contact with and (suspected) exposure to covid-19: Secondary | ICD-10-CM | POA: Diagnosis not present

## 2020-08-20 DIAGNOSIS — I10 Essential (primary) hypertension: Secondary | ICD-10-CM | POA: Insufficient documentation

## 2020-08-20 DIAGNOSIS — L0291 Cutaneous abscess, unspecified: Secondary | ICD-10-CM | POA: Insufficient documentation

## 2020-08-20 DIAGNOSIS — K529 Noninfective gastroenteritis and colitis, unspecified: Secondary | ICD-10-CM | POA: Insufficient documentation

## 2020-08-20 DIAGNOSIS — M549 Dorsalgia, unspecified: Secondary | ICD-10-CM

## 2020-08-20 DIAGNOSIS — M545 Low back pain, unspecified: Secondary | ICD-10-CM | POA: Insufficient documentation

## 2020-08-20 LAB — URINALYSIS, COMPLETE (UACMP) WITH MICROSCOPIC
Bilirubin Urine: NEGATIVE
Glucose, UA: NEGATIVE mg/dL
Ketones, ur: 5 mg/dL — AB
Leukocytes,Ua: NEGATIVE
Nitrite: NEGATIVE
Protein, ur: NEGATIVE mg/dL
Specific Gravity, Urine: 1.027 (ref 1.005–1.030)
pH: 5 (ref 5.0–8.0)

## 2020-08-20 LAB — RESP PANEL BY RT-PCR (FLU A&B, COVID) ARPGX2
Influenza A by PCR: NEGATIVE
Influenza B by PCR: NEGATIVE
SARS Coronavirus 2 by RT PCR: NEGATIVE

## 2020-08-20 MED ORDER — BACLOFEN 10 MG PO TABS: 10.0000 mg | ORAL_TABLET | Freq: Three times a day (TID) | ORAL | 0 refills | Status: AC

## 2020-08-20 MED ORDER — MELOXICAM 15 MG PO TABS: 15.0000 mg | ORAL_TABLET | Freq: Every day | ORAL | 2 refills | Status: AC

## 2020-08-20 MED ORDER — CEPHALEXIN 500 MG PO CAPS: 500.0000 mg | ORAL_CAPSULE | Freq: Three times a day (TID) | ORAL | 0 refills | Status: DC

## 2020-08-20 NOTE — ED Triage Notes (Signed)
Pt via POV from home. Pt c/o lower back pain for about 2 weeks. Pt states the past weeks it has gotten worse. Pt denies urinary symptoms. Denies any strenuous work. Pt is A&Ox4 and NAD.

## 2020-08-20 NOTE — Discharge Instructions (Signed)
Follow-up with your regular doctor if not improving in 3 days.  Return emergency department worsening.  Take medication as prescribed.  Covid test should result in a few hours.  You can review this result on [facility] MyChart.  If you have continued back pain could also follow-up orthopedics, Dr. Harlow Mares phone number is attached.

## 2020-08-20 NOTE — ED Notes (Addendum)
Pt states has pain in sacral, coccyx area since 2 wks ago that has gotten worse over last week. Pt rates pain as 9/10. Pt ambulatory to hall bed. Pt cannot identify triggering event or injury that could have led to back pain.  Pt has been alternating tylenol with ibuprophen over last 2 weeks with no relief. Pt took 1000mg  tylenol today at 1000. No ibuprophen today.

## 2020-08-20 NOTE — ED Provider Notes (Signed)
[facility] Emergency Department Provider Note  ____________________________________________   Event Date/Time   First MD Initiated Contact with Patient 08/20/20 1620     (approximate)  I have reviewed the triage vital signs and the nursing notes.   HISTORY  Chief Complaint Back Pain    HPI Elizabeth Chambers is a 60 y.o. female presents emergency department with low back pain, states patient states it hurts to sit, states she feels like area is very sore but is not swollen.  Patient started with lower back pain prior to having a gastrointestinal infection.  She states she started having vomiting and diarrhea on Monday of this week.  States coworker had been sick.  She denies any chest pain or shortness of breath.  No fever or chills at this time.    Past Medical History:  Diagnosis Date  . Hypertension     There are no problems to display for this patient.   Past Surgical History:  Procedure Laterality Date  . UPPER ESOPHAGEAL ENDOSCOPIC ULTRASOUND (EUS) N/A 03/03/2016   Procedure: UPPER ESOPHAGEAL ENDOSCOPIC ULTRASOUND (EUS);  Surgeon: Reita Cliche, MD;  Location: [facility] ENDOSCOPY;  Service: Gastroenterology;  Laterality: N/A;    Prior to Admission medications   Medication Sig Start Date End Date Taking? Authorizing Provider  baclofen (LIORESAL) 10 MG tablet Take 1 tablet (10 mg total) by mouth 3 (three) times daily for 7 days. 08/20/20 08/27/20 Yes Gian Ybarra, Linden Dolin, PA-C  cephALEXin (KEFLEX) 500 MG capsule Take 1 capsule (500 mg total) by mouth 3 (three) times daily. 08/20/20  Yes Desmon Hitchner, Linden Dolin, PA-C  meloxicam (MOBIC) 15 MG tablet Take 1 tablet (15 mg total) by mouth daily. 08/20/20 08/20/21 Yes Benett Swoyer, Linden Dolin, PA-C  acetaminophen (TYLENOL) 500 MG tablet Take 500 mg by mouth every 6 (six) hours as needed.    [provider]  amLODipine (NORVASC) 5 MG tablet Take 1 tablet (5 mg total) by mouth daily. 07/27/19 07/26/20  Laban Emperor, PA-C   hydrochlorothiazide (MICROZIDE) 12.5 MG capsule Take 1 capsule (12.5 mg total) by mouth daily. 01/27/16 02/26/16  Joanne Gavel, MD  ibuprofen (ADVIL) 600 MG tablet Take 1 tablet (600 mg total) by mouth every 6 (six) hours as needed. 07/27/19   Laban Emperor, PA-C    Allergies Patient has no known allergies.  History reviewed. No pertinent family history.  Social History Social History   Tobacco Use  . Smoking status: Never Smoker  . Smokeless tobacco: Never Used  . Tobacco comment: marijuana  Vaping Use  . Vaping Use: Never used  Substance Use Topics  . Alcohol use: No  . Drug use: No    Frequency: 2.0 times per week    Review of Systems  Constitutional: No fever/chills Eyes: No visual changes. ENT: No sore throat. Respiratory: Denies cough Cardiovascular: Denies chest pain Gastrointestinal: Denies abdominal pain, positive vomiting and diarrhea Genitourinary: Negative for dysuria. Musculoskeletal: Positive for back pain. Skin: Negative for rash. Psychiatric: no mood changes,     ____________________________________________   PHYSICAL EXAM:  VITAL SIGNS: ED Triage Vitals [08/20/20 1546]  Enc Vitals Group     BP (!) 161/98     Pulse Rate 83     Resp 20     Temp 98.4 F (36.9 C)     Temp Source Oral     SpO2 100 %     Weight 174 lb (78.9 kg)     Height 5\' 6"  (1.676 m)  Head Circumference      Peak Flow      Pain Score 8     Pain Loc      Pain Edu?      Excl. in [place]?     Constitutional: Alert and oriented. Well appearing and in no acute distress. Eyes: Conjunctivae are normal.  Head: Atraumatic. Nose: No congestion/rhinnorhea. Mouth/Throat: Mucous membranes are moist.   Neck:  supple no lymphadenopathy noted Cardiovascular: Normal rate, regular rhythm. Heart sounds are normal Respiratory: Normal respiratory effort.  No retractions, lungs c t a  Abd: soft nontender bs normal all 4 quad GU: deferred Musculoskeletal: FROM all extremities, warm and  well perfused, upper right buttock is tender to palpation, questionable beginning of an abscess, no redness or pus is noted, neurovascular is intact Neurologic:  Normal speech and language.  Skin:  Skin is warm, dry and intact. No rash noted. Psychiatric: Mood and affect are normal. Speech and behavior are normal.  ____________________________________________   LABS (all labs ordered are listed, but only abnormal results are displayed)  Labs Reviewed  URINALYSIS, COMPLETE (UACMP) WITH MICROSCOPIC - Abnormal; Notable for the following components:      Result Value   Color, Urine YELLOW (*)    APPearance HAZY (*)    Hgb urine dipstick MODERATE (*)    Ketones, ur 5 (*)    Bacteria, UA FEW (*)    All other components within normal limits  RESP PANEL BY RT-PCR (FLU A&B, COVID) ARPGX2   ____________________________________________   ____________________________________________  RADIOLOGY  X-ray lumbar spine  ____________________________________________   PROCEDURES  Procedure(s) performed: No  Procedures    ____________________________________________   INITIAL IMPRESSION / ASSESSMENT AND PLAN / ED COURSE  Pertinent labs & imaging results that were available during my care of the patient were reviewed by me and considered in my medical decision making (see chart for details).   Patient is 60 year old female presents with lower back pain, vomiting and diarrhea.  She HPI.  Physical exam shows patient appears stable.  Patient originally been put in the hallway but due to the buttock/rectal pain feel that I need her in a exam room and moved her into a flex 41.  UA, COVID test, lumbar spine x-ray  Urinalysis appears to be normal, COVID test pending, lumbar spine x-ray reviewed by me, confirmed by radiology to have degenerative changes but no acute abnormality  I did explain findings to the patient.  She was given a prescription for Mobic, baclofen, and Keflex.  She is to  follow-up with her regular doctor if not improving in 2 to 3 days.  Return emergency department worsening.  She was given a work note to return on Monday.  And discharged in stable condition.     Elizabeth Chambers was evaluated in Emergency Department on 08/20/2020 for the symptoms described in the history of present illness. She was evaluated in the context of the global COVID-19 pandemic, which necessitated consideration that the patient might be at risk for infection with the SARS-CoV-2 virus that causes COVID-19. Institutional protocols and algorithms that pertain to the evaluation of patients at risk for COVID-19 are in a state of rapid change based on information released by regulatory bodies including the CDC and federal and state organizations. These policies and algorithms were followed during the patient's care in the ED.    As part of my medical decision making, I reviewed the following data within the [facility] notes reviewed and incorporated,  Labs reviewed , Old chart reviewed, Radiograph reviewed , Notes from prior ED visits and [place] Controlled Substance Database  ____________________________________________   FINAL CLINICAL IMPRESSION(S) / ED DIAGNOSES  Final diagnoses:  Abscess  Acute back pain, unspecified back location, unspecified back pain laterality  Gastroenteritis      NEW MEDICATIONS STARTED DURING THIS VISIT:  New Prescriptions   BACLOFEN (LIORESAL) 10 MG TABLET    Take 1 tablet (10 mg total) by mouth 3 (three) times daily for 7 days.   CEPHALEXIN (KEFLEX) 500 MG CAPSULE    Take 1 capsule (500 mg total) by mouth 3 (three) times daily.   MELOXICAM (MOBIC) 15 MG TABLET    Take 1 tablet (15 mg total) by mouth daily.     Note:  This document was prepared using Dragon voice recognition software and may include unintentional dictation errors.    Versie Starks, PA-C 08/20/20 1842    Lucrezia Starch, MD 08/20/20 2017

## 2021-01-14 ENCOUNTER — Other Ambulatory Visit: Payer: Self-pay | Admitting: Family Medicine

## 2021-01-14 DIAGNOSIS — N644 Mastodynia: Secondary | ICD-10-CM

## 2021-01-14 DIAGNOSIS — Z1231 Encounter for screening mammogram for malignant neoplasm of breast: Secondary | ICD-10-CM

## 2023-05-07 ENCOUNTER — Emergency Department
Admission: EM | Admit: 2023-05-07 | Discharge: 2023-05-07 | Disposition: A | Discharge status: Home / Self Care | Payer: Self-pay | Attending: Emergency Medicine | Admitting: Emergency Medicine

## 2023-05-07 ENCOUNTER — Other Ambulatory Visit: Payer: Self-pay

## 2023-05-07 DIAGNOSIS — Z20822 Contact with and (suspected) exposure to covid-19: Secondary | ICD-10-CM | POA: Insufficient documentation

## 2023-05-07 DIAGNOSIS — J101 Influenza due to other identified influenza virus with other respiratory manifestations: Secondary | ICD-10-CM | POA: Diagnosis not present

## 2023-05-07 DIAGNOSIS — I10 Essential (primary) hypertension: Secondary | ICD-10-CM | POA: Diagnosis not present

## 2023-05-07 DIAGNOSIS — R110 Nausea: Secondary | ICD-10-CM | POA: Diagnosis not present

## 2023-05-07 LAB — RESP PANEL BY RT-PCR (RSV, FLU A&B, COVID)  RVPGX2
Influenza A by PCR: POSITIVE — AB
Influenza B by PCR: NEGATIVE
Resp Syncytial Virus by PCR: NEGATIVE
SARS Coronavirus 2 by RT PCR: NEGATIVE

## 2023-05-07 MED ORDER — IBUPROFEN 800 MG PO TABS
800.0000 mg | ORAL_TABLET | Freq: Once | ORAL | Status: AC
  Administered 2023-05-07: 800 mg via ORAL
  Filled 2023-05-07: qty 1

## 2023-05-07 MED ORDER — BENZONATATE 100 MG PO CAPS: 100.0000 mg | ORAL_CAPSULE | Freq: Three times a day (TID) | ORAL | 0 refills | Status: DC | PRN

## 2023-05-07 MED ORDER — ONDANSETRON 4 MG PO TBDP
4.0000 mg | ORAL_TABLET | Freq: Once | ORAL | Status: AC
  Administered 2023-05-07: 4 mg via ORAL
  Filled 2023-05-07: qty 1

## 2023-05-07 MED ORDER — ONDANSETRON 4 MG PO TBDP: 4.0000 mg | ORAL_TABLET | Freq: Three times a day (TID) | ORAL | 0 refills | Status: DC | PRN

## 2023-05-07 NOTE — ED Triage Notes (Signed)
 Pt to ed from home for flu like symptoms x 3 days. Pt is caox4, in no acute distress and ambulatory in triage.

## 2023-05-07 NOTE — ED Provider Notes (Signed)
 [facility] Emergency Department Provider Note     Event Date/Time   First MD Initiated Contact with Patient 05/07/23 1953     (approximate)   History   flu like symptoms   HPI  Elizabeth Chambers is a 63 y.o. female with a history of HTN presents to the ED for evaluation of flulike symptoms x 3 days.  Endorses sick contacts at work with similar symptoms. Has tried extra strength tylenol  with no relief. Endorses nausea and loss of appetite.     Physical Exam   Triage Vital Signs: ED Triage Vitals  Encounter Vitals Group     BP 05/07/23 1906 (!) 149/98     Systolic BP Percentile --      Diastolic BP Percentile --      Pulse Rate 05/07/23 1906 (!) 110     Resp 05/07/23 1906 20     Temp 05/07/23 1906 99.8 F (37.7 C)     Temp Source 05/07/23 1906 Oral     SpO2 05/07/23 1906 100 %     Weight 05/07/23 1907 150 lb (68 kg)     Height 05/07/23 1907 5' 6 (1.676 m)     Head Circumference --      Peak Flow --      Pain Score 05/07/23 1912 0     Pain Loc --      Pain Education --      Exclude from Growth Chart --     Most recent vital signs: Vitals:   05/07/23 1906 05/07/23 2020  BP: (!) 149/98 (!) 155/88  Pulse: (!) 110 90  Resp: 20 18  Temp: 99.8 F (37.7 C) 100 F (37.8 C)  SpO2: 100% 100%   General Awake, no distress. Non-toxic appearing. HEENT NCAT. PERRL. EOMI. No rhinorrhea. Mucous membranes are moist.  CV:  Good peripheral perfusion.  RESP:  Normal effort. LCTAB ABD:  No distention.   ED Results / Procedures / Treatments   Labs (all labs ordered are listed, but only abnormal results are displayed) Labs Reviewed  RESP PANEL BY RT-PCR (RSV, FLU A&B, COVID)  RVPGX2 - Abnormal; Notable for the following components:      Result Value   Influenza A by PCR POSITIVE (*)    All other components within normal limits   No results found.  PROCEDURES:  Critical Care performed: No  Procedures  MEDICATIONS ORDERED IN ED: Medications   ondansetron  (ZOFRAN -ODT) disintegrating tablet 4 mg (4 mg Oral Given 05/07/23 2028)  ibuprofen  (ADVIL ) tablet 800 mg (800 mg Oral Given 05/07/23 2028)     IMPRESSION / MDM / ASSESSMENT AND PLAN / ED COURSE  I reviewed the triage vital signs and the nursing notes.                               63 y.o. female presents to the emergency department for evaluation and treatment of flu-like symptoms. See HPI for further details.   Differential diagnosis includes, but is not limited to viral URI, PNA, bronchitis  Patient's presentation is most consistent with acute complicated illness / injury requiring diagnostic workup.  Patient is alert and oriented.  She has a mildly elevated BP and is initially tachycardic on arrival to ED.  This has improved when roomed. Respiratory panel is positive for influenza A.  Lung exam is normal.  Ibuprofen  and Zofran  given in ED.  Patient is in stable and  satisfactory condition for discharge home.  Symptomatic care discussed for at home use.  Work note provided.  Patient is encouraged to follow-up with PCP for further management as needed.  A list has been provided for her.  ED return precautions discussed.  Patient verbalized understanding and agrees with treatment plan.  FINAL CLINICAL IMPRESSION(S) / ED DIAGNOSES   Final diagnoses:  Influenza A    Rx / DC Orders   ED Discharge Orders          Ordered    ondansetron  (ZOFRAN -ODT) 4 MG disintegrating tablet  Every 8 hours PRN        05/07/23 2020    benzonatate  (TESSALON  PERLES) 100 MG capsule  3 times daily PRN        05/07/23 2020             Note:  This document was prepared using Dragon voice recognition software and may include unintentional dictation errors.    Margrette, Priscille Gruhn A, PA-C 05/07/23 2035    Viviann Pastor, MD 05/07/23 2238

## 2024-01-29 ENCOUNTER — Emergency Department
Admission: EM | Admit: 2024-01-29 | Discharge: 2024-01-29 | Disposition: A | Discharge status: Home / Self Care | Payer: Worker's Compensation | Attending: Emergency Medicine | Admitting: Emergency Medicine

## 2024-01-29 ENCOUNTER — Other Ambulatory Visit: Payer: Self-pay

## 2024-01-29 DIAGNOSIS — R519 Headache, unspecified: Secondary | ICD-10-CM

## 2024-01-29 DIAGNOSIS — I10 Essential (primary) hypertension: Secondary | ICD-10-CM | POA: Insufficient documentation

## 2024-01-29 MED ORDER — AMLODIPINE BESYLATE 5 MG PO TABS: 5.0000 mg | ORAL_TABLET | Freq: Every day | ORAL | 2 refills | Status: DC

## 2024-01-29 NOTE — ED Triage Notes (Signed)
 Pt was at work when some serving platters fell on her head from a shelf. She noticed a little bit of blood which has stopped but headache is still present.

## 2024-01-29 NOTE — ED Notes (Signed)
 See triage note  Presents with headache  States she had some serving platters fall on her head  No LOC

## 2024-01-29 NOTE — Discharge Instructions (Addendum)
 For the skin care concerns I recommend the ordinary brand as it is affordable.  Look for a product that works for skin evening.  I recommend niacinamide serum as this is good for multiple skin conditions.  I have also attached information for dermatology.  For your headache, you can take 650 mg of Tylenol  and 600 mg of ibuprofen  every 6 hours as needed for pain.  Return to the emergency department if you have multiple episodes of vomiting, pain you cannot control at home or changes in your vision.  For your blood pressure, I have sent a medication called amlodipine .  This is to be taken once a day.  I have also attached information for primary care providers.  Please call one of them and schedule an appointment.  It is important that your blood pressure is controlled as high blood pressure can lead to lots of negative health outcomes.

## 2024-01-29 NOTE — ED Provider Notes (Signed)
 [facility] Provider Note    Event Date/Time   First MD Initiated Contact with Patient 01/29/24 [phone]     (approximate)   History   Headache   HPI  Elizabeth Chambers is a 63 y.o. female with PMH of hypertension who presents for evaluation of a headache. Last night at work some serving platters fell on the patient's head. She had a small amount of bleeding that has now stopped.  Patient reports she still had a headache.  She did not pass out, no vomiting, no vision changes.  She took some Advil  for pain this morning.      Physical Exam   Triage Vital Signs: ED Triage Vitals  Encounter Vitals Group     BP 01/29/24 0917 (!) 197/90     Girls Systolic BP Percentile --      Girls Diastolic BP Percentile --      Boys Systolic BP Percentile --      Boys Diastolic BP Percentile --      Pulse Rate 01/29/24 0917 70     Resp 01/29/24 0917 16     Temp 01/29/24 0917 98.5 F (36.9 C)     Temp Source 01/29/24 0917 Oral     SpO2 01/29/24 0917 98 %     Weight 01/29/24 0943 149 lb 14.6 oz (68 kg)     Height 01/29/24 0943 5' 6 (1.676 m)     Head Circumference --      Peak Flow --      Pain Score 01/29/24 0917 7     Pain Loc --      Pain Education --      Exclude from Growth Chart --     Most recent vital signs: Vitals:   01/29/24 0917 01/29/24 1111  BP: (!) 197/90 (!) 177/99  Pulse: 70   Resp: 16   Temp: 98.5 F (36.9 C)   SpO2: 98%    General: Awake, no distress.  CV:  Good peripheral perfusion.  Resp:  Normal effort.  Abd:  No distention.  Other:  PERRL, EOM intact, no focal neurodeficits, no ataxia, no pronator drift.  Small amount of dried blood over the left parietal scalp with corresponding tenderness to palpation, no laceration.   ED Results / Procedures / Treatments   Labs (all labs ordered are listed, but only abnormal results are displayed) Labs Reviewed - No data to display    PROCEDURES:  Critical Care performed:  No  Procedures   MEDICATIONS ORDERED IN ED: Medications - No data to display   IMPRESSION / MDM / ASSESSMENT AND PLAN / ED COURSE  I reviewed the triage vital signs and the nursing notes.                             63 year old female presents for evaluation of headache.  Blood pressure is elevated but patient does not have history of hypertension.  Vital signs stable otherwise.  Patient NAD on exam.  Differential diagnosis includes, but is not limited to, contusion, tension headache, laceration, abrasion, concussion.  Patient's presentation is most consistent with acute, uncomplicated illness.  Physical exam is reassuring.  There are no focal neurodeficits noted.  Do not feel that imaging is indicated at this time.  Patient denies symptoms aside from headache.  Suspect this is soreness from getting hit in the head.  She was given a note to keep her out of work  today but she can return tomorrow.  Did discuss return precautions including persistence of pain, multiple episodes of vomiting or visual changes.    Patient asked about some areas on her face where her skin tone is little bit lighter.  She explained that she has been applying hydrocortisone cream which I recommended against this this can cause skin thinning and changes in skin color.  Did recommend some drugstore brand products for her to try it and will attach information for dermatology.  Patient has a history of hypertension and has not seen a primary care provider in several years.  I see in her medication list that she has previously been prescribed amlodipine .  Discussed restarting this medication today and patient was agreeable.  Will send her two months of refills.  Did attach information for primary care provider and advised her to schedule follow-up.  Patient voiced understanding, all questions were answered and she was stable at discharge.      FINAL CLINICAL IMPRESSION(S) / ED DIAGNOSES   Final diagnoses:  Acute  nonintractable headache, unspecified headache type  Uncontrolled hypertension     Rx / DC Orders   ED Discharge Orders          Ordered    amLODipine  (NORVASC ) 5 MG tablet  Daily        01/29/24 1109    Ambulatory Referral to Primary Care (Establish Care)        01/29/24 1132             Note:  This document was prepared using Dragon voice recognition software and may include unintentional dictation errors.   Cleaster Tinnie LABOR, PA-C 01/29/24 1132    Jossie Artist POUR, MD 01/31/24 1134

## 2024-01-30 ENCOUNTER — Ambulatory Visit: Payer: Self-pay

## 2024-01-30 NOTE — Telephone Encounter (Signed)
 FYI Only or Action Required?: FYI only for provider.  Patient was last seen in primary care on Not yet established.  Called Nurse Triage reporting Headache.  Symptoms began yesterday.  Interventions attempted: OTC medications: Advil  and Other: Seen in ED.  Symptoms are: gradually improving.  Triage Disposition: No disposition on file.  Patient/caregiver understands and will follow disposition?:  Reason for Disposition  Scalp bruise, pain, or swelling  Answer Assessment - Initial Assessment Questions Taking Advil   1. MECHANISM: How did the injury happen? For falls, ask: What height did you fall from? and What surface did you fall against?      Hit in the head with metal trays at work  2. ONSET: When did the injury happen? (e.g., minutes, hours ago)      Sunday  3. NEUROLOGIC SYMPTOMS: Was there any loss of consciousness? Are there any other neurological symptoms?      No  4. MENTAL STATUS: Does the person know who they are, who you are, and where they are?      Aox4  5. LOCATION: What part of the head was hit?      Left side top   6. PAIN: Is there any pain? If Yes, ask: How bad is it? (Scale 0-10; or none, mild, moderate, severe)     Head is sore, mild  7. OTHER SYMPTOMS: Do you have any other symptoms? (e.g., neck pain, vomiting)       Denies  8. PREGNANCY: Is there any chance you are pregnant? When was your last menstrual period?       N/A  Protocols used: Head Injury-A-AH  Copied from CRM [id number]. Topic: Clinical - Red Word Triage >> Jan 30, 2024 11:16 AM Elizabeth Chambers wrote: Red Word that prompted transfer to Nurse Triage: The patient was struck in the head yesterday at work by metal lids and is currently reporting pain and soreness. The patient was evaluated on 10/6 at the [facility] Emergency Room at [facility]

## 2024-03-05 ENCOUNTER — Ambulatory Visit (INDEPENDENT_AMBULATORY_CARE_PROVIDER_SITE_OTHER)

## 2024-03-05 VITALS — BP 131/66 | HR 61 | Resp 16 | Ht 66.0 in | Wt 151.0 lb

## 2024-03-05 DIAGNOSIS — M791 Myalgia, unspecified site: Secondary | ICD-10-CM | POA: Insufficient documentation

## 2024-03-05 DIAGNOSIS — L853 Xerosis cutis: Secondary | ICD-10-CM | POA: Insufficient documentation

## 2024-03-05 DIAGNOSIS — Z131 Encounter for screening for diabetes mellitus: Secondary | ICD-10-CM

## 2024-03-05 DIAGNOSIS — Z114 Encounter for screening for human immunodeficiency virus [HIV]: Secondary | ICD-10-CM

## 2024-03-05 DIAGNOSIS — M19012 Primary osteoarthritis, left shoulder: Secondary | ICD-10-CM | POA: Insufficient documentation

## 2024-03-05 DIAGNOSIS — I10 Essential (primary) hypertension: Secondary | ICD-10-CM | POA: Insufficient documentation

## 2024-03-05 DIAGNOSIS — L816 Other disorders of diminished melanin formation: Secondary | ICD-10-CM | POA: Insufficient documentation

## 2024-03-05 DIAGNOSIS — Z136 Encounter for screening for cardiovascular disorders: Secondary | ICD-10-CM | POA: Diagnosis not present

## 2024-03-05 DIAGNOSIS — Z1231 Encounter for screening mammogram for malignant neoplasm of breast: Secondary | ICD-10-CM

## 2024-03-05 DIAGNOSIS — Z1159 Encounter for screening for other viral diseases: Secondary | ICD-10-CM

## 2024-03-05 MED ORDER — MELOXICAM 7.5 MG PO TABS: 7.5000 mg | ORAL_TABLET | Freq: Every day | ORAL | 3 refills | Status: AC

## 2024-03-05 MED ORDER — AMLODIPINE BESYLATE 5 MG PO TABS: 5.0000 mg | ORAL_TABLET | Freq: Every day | ORAL | 3 refills | Status: AC

## 2024-03-05 MED ORDER — CYCLOBENZAPRINE HCL 5 MG PO TABS: 5.0000 mg | ORAL_TABLET | Freq: Every day | ORAL | 3 refills | Status: AC | PRN
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
## 2024-03-05 NOTE — Patient Instructions (Signed)
 I recommend Cetaphil moisturizer for dry skin.

## 2024-04-01 ENCOUNTER — Encounter: Payer: Self-pay | Admitting: Dermatology

## 2024-04-01 ENCOUNTER — Ambulatory Visit: Admitting: Dermatology

## 2024-04-01 DIAGNOSIS — L219 Seborrheic dermatitis, unspecified: Secondary | ICD-10-CM | POA: Diagnosis not present

## 2024-04-01 MED ORDER — HYDROCORTISONE 2.5 % EX CREA: TOPICAL_CREAM | CUTANEOUS | 3 refills | Status: AC

## 2024-04-01 MED ORDER — KETOCONAZOLE 2 % EX CREA: TOPICAL_CREAM | CUTANEOUS | 5 refills | Status: AC

## 2024-04-01 NOTE — Progress Notes (Signed)
   New Patient Visit   Subjective  Elizabeth Chambers is a 63 y.o. female who presents for the following: Rash on face. Comes and goes. Dur: off and on for years. Has used OTC HC cream to help with itching. PCP advised to D/C [facility] for now.  States does not affect ears or scalp.   The following portions of the chart were reviewed this encounter and updated as appropriate: medications, allergies, medical history  Review of Systems:  No other skin or systemic complaints except as noted in HPI or Assessment and Plan.  Objective  Well appearing patient in no apparent distress; mood and affect are within normal limits.  A focused examination was performed of the following areas: Face   Relevant exam findings are noted in the Assessment and Plan.          Assessment & Plan   SEBORRHEIC DERMATITIS    SEBORRHEIC DERMATITIS; petaloid Exam: Pink to hyperpigmented circular scaly patches with greasy scale at glabella nasolabial folds perioral  Chronic and persistent condition with duration or expected duration over one year. Condition is bothersome/symptomatic for patient. Currently flared.   Seborrheic Dermatitis is a chronic persistent rash characterized by pinkness and scaling most commonly of the mid face but also can occur on the scalp (dandruff), ears; mid chest, mid back and groin.  It tends to be exacerbated by stress and cooler weather.  People who have neurologic disease may experience new onset or exacerbation of existing seborrheic dermatitis.  The condition is not curable but treatable and can be controlled.  Treatment Plan: Start Ketoconazole  2% cream twice a day to face Start Hydrocortisone  2.5% cream apply twice a day to face until itching/irritation has resolved, then use as needed for flares.     Return for Seborrheic Dermatitis Follow Up in 6-8 weeks.  I, Jill Parcell, CMA, am acting as scribe for Boneta Sharps, MD.   Documentation: I have reviewed the above  documentation for accuracy and completeness, and I agree with the above.  Boneta Sharps, MD

## 2024-04-01 NOTE — Patient Instructions (Addendum)
 Seborrheic Dermatitis is a chronic persistent rash characterized by pinkness and scaling most commonly of the mid face but also can occur on the scalp (dandruff), ears; mid chest, mid back and groin.  It tends to be exacerbated by stress and cooler weather.  People who have neurologic disease may experience new onset or exacerbation of existing seborrheic dermatitis.  The condition is not curable but treatable and can be controlled.    Start Ketoconazole  2% cream apply twice a day to face. This medication is safe to use every day. Start Hydrocortisone  2.5% cream apply twice a day to face until itching/irritation has resolved, then use as needed for flares.      Recommend daily broad spectrum sunscreen SPF 30+ to sun-exposed areas, reapply every 2 hours as needed. Call for new or changing lesions.  Staying in the shade or wearing long sleeves, sun glasses (UVA+UVB protection) and wide brim hats (4-inch brim around the entire circumference of the hat) are also recommended for sun protection.     Due to recent changes in healthcare laws, you may see results of your pathology and/or laboratory studies on MyChart before the doctors have had a chance to review them. We understand that in some cases there may be results that are confusing or concerning to you. Please understand that not all results are received at the same time and often the doctors may need to interpret multiple results in order to provide you with the best plan of care or course of treatment. Therefore, we ask that you please give us  2 business days to thoroughly review all your results before contacting the office for clarification. Should we see a critical lab result, you will be contacted sooner.   If You Need Anything After Your Visit  If you have any questions or concerns for your doctor, please call our main line at [phone] and press option 4 to reach your doctor's medical assistant. If no one answers, please leave a  voicemail as directed and we will return your call as soon as possible. Messages left after 4 pm will be answered the following business day.   You may also send us  a message via MyChart. We typically respond to MyChart messages within 1-2 business days.  For prescription refills, please ask your pharmacy to contact our office. Our fax number is [phone].  If you have an urgent issue when the clinic is closed that cannot wait until the next business day, you can page your doctor at the number below.    Please note that while we do our best to be available for urgent issues outside of office hours, we are not available 24/7.   If you have an urgent issue and are unable to reach us , you may choose to seek medical care at your doctor's office, retail clinic, urgent care center, or emergency room.  If you have a medical emergency, please immediately call 911 or go to the emergency department.  Pager Numbers  - Dr. Hester: [phone]  - Dr. Jackquline: [phone]  - Dr. Claudene: [phone]   - Dr. Raymund: [phone]  In the event of inclement weather, please call our main line at [phone] for an update on the status of any delays or closures.  Dermatology Medication Tips: Please keep the boxes that topical medications come in in order to help keep track of the instructions about where and how to use these. Pharmacies typically print the medication instructions only on the boxes and not directly on the  medication tubes.   If your medication is too expensive, please contact our office at [phone] option 4 or send us  a message through MyChart.   We are unable to tell what your co-pay for medications will be in advance as this is different depending on your insurance coverage. However, we may be able to find a substitute medication at lower cost or fill out paperwork to get insurance to cover a needed medication.   If a prior authorization is required to get your medication  covered by your insurance company, please allow us  1-2 business days to complete this process.  Drug prices often vary depending on where the prescription is filled and some pharmacies may offer cheaper prices.  The website www.goodrx.com contains coupons for medications through different pharmacies. The prices here do not account for what the cost may be with help from insurance (it may be cheaper with your insurance), but the website can give you the price if you did not use any insurance.  - You can print the associated coupon and take it with your prescription to the pharmacy.  - You may also stop by our office during regular business hours and pick up a GoodRx coupon card.  - If you need your prescription sent electronically to a different pharmacy, notify our office through [facility] or by phone at [phone] option 4.     Si Usted Necesita Algo Despus de Su Visita  Tambin puede enviarnos un mensaje a travs de Clinical Cytogeneticist. Por lo general respondemos a los mensajes de MyChart en el transcurso de 1 a 2 das hbiles.  Para renovar recetas, por favor pida a su farmacia que se ponga en contacto con nuestra oficina. Randi lakes de fax es [place] [phone].  Si tiene un asunto urgente cuando la clnica est cerrada y que no puede esperar hasta el siguiente da hbil, puede llamar/localizar a su doctor(a) al nmero que aparece a continuacin.   Por favor, tenga en cuenta que aunque hacemos todo lo posible para estar disponibles para asuntos urgentes fuera del horario de [place], no estamos disponibles las 24 horas del da, los 7 [address]  [address] de la [place].   Si tiene un problema urgente y no puede comunicarse con nosotros, puede optar por buscar atencin mdica  en el consultorio de su doctor(a), en una clnica privada, en un centro de atencin urgente o en una sala de emergencias.  Si tiene engineer, drilling, por favor llame inmediatamente al 911 o vaya a la sala de  emergencias.  Nmeros de bper  - Dr. Hester: [phone]  - Dra. Jackquline: [phone]  - Dr. Claudene: [phone]  - Dra. Kitts: [phone]  En caso de inclemencias del [place], por favor llame a nuestra lnea principal al [phone] para una actualizacin sobre el estado de cualquier retraso o cierre.  Consejos para la medicacin en dermatologa: Por favor, guarde las cajas en las que vienen los medicamentos de uso tpico para ayudarle a seguir las instrucciones sobre dnde y cmo usarlos. Las farmacias generalmente imprimen las instrucciones del medicamento slo en las cajas y no directamente en los tubos del [place].   Si su medicamento es muy caro, por favor, pngase en contacto con landry rieger llamando al [phone] y presione la opcin 4 o envenos un mensaje a travs de Clinical Cytogeneticist.   No podemos decirle cul ser su copago por los medicamentos por adelantado ya que esto es diferente dependiendo de la cobertura de su seguro. Sin embargo, es posible que podamos clinical research associate  un medicamento sustituto a audiological scientist un formulario para que el seguro cubra el medicamento que se considera necesario.   Si se requiere una autorizacin previa para que su compaa de seguros cubra su medicamento, por favor permtanos de 1 a 2 das hbiles para completar este proceso.  Los precios de los medicamentos varan con frecuencia dependiendo del environmental consultant de dnde se surte la receta y alguna farmacias pueden ofrecer precios ms baratos.  El sitio web www.goodrx.com tiene cupones para medicamentos de health and safety inspector. Los precios aqu no tienen en cuenta lo que podra costar con la ayuda del seguro (puede ser ms barato con su seguro), pero el sitio web puede darle el precio si no utiliz tourist information centre manager.  - Puede imprimir el cupn correspondiente y llevarlo con su receta a la farmacia.  - Tambin puede pasar por nuestra oficina durante el horario de atencin regular y education officer, museum una tarjeta  de cupones de GoodRx.  - Si necesita que su receta se enve electrnicamente a una farmacia diferente, informe a nuestra oficina a travs de MyChart de [place] o por telfono llamando al [phone] y presione la opcin 4.

## 2024-04-04 ENCOUNTER — Ambulatory Visit

## 2024-05-13 ENCOUNTER — Ambulatory Visit: Admitting: Dermatology
